# Patient Record
Sex: Female | Born: 1975 | Race: Black or African American | Hispanic: No | Marital: Married | Smoking: Never smoker
Health system: Southern US, Community
[De-identification: ages and names within clinical notes are randomized; demographics above are authoritative.]

## PROBLEM LIST (undated history)

## (undated) DIAGNOSIS — D649 Anemia, unspecified: Secondary | ICD-10-CM

## (undated) DIAGNOSIS — I10 Essential (primary) hypertension: Secondary | ICD-10-CM

## (undated) DIAGNOSIS — E669 Obesity, unspecified: Secondary | ICD-10-CM

## (undated) HISTORY — DX: Obesity, unspecified: E66.9

## (undated) HISTORY — DX: Anemia, unspecified: D64.9

## (undated) HISTORY — DX: Essential (primary) hypertension: I10

---

## 2013-01-27 ENCOUNTER — Other Ambulatory Visit: Payer: Self-pay

## 2013-01-28 ENCOUNTER — Other Ambulatory Visit: Payer: Self-pay | Admitting: Family Medicine

## 2013-01-28 ENCOUNTER — Other Ambulatory Visit: Payer: Self-pay | Admitting: Internal Medicine

## 2013-01-28 DIAGNOSIS — N644 Mastodynia: Secondary | ICD-10-CM

## 2013-02-04 ENCOUNTER — Ambulatory Visit
Admission: RE | Admit: 2013-02-04 | Discharge: 2013-02-04 | Disposition: A | Discharge status: Home / Self Care | Payer: PRIVATE HEALTH INSURANCE | Source: Ambulatory Visit | Attending: Internal Medicine | Admitting: Internal Medicine

## 2013-02-04 DIAGNOSIS — N644 Mastodynia: Secondary | ICD-10-CM

## 2013-06-27 LAB — HEPATIC FUNCTION PANEL
ALK PHOS: 70 U/L (ref 25–125)
ALT: 10 U/L (ref 7–35)
AST: 12 U/L — AB (ref 13–35)
BILIRUBIN, TOTAL: 0.2 mg/dL

## 2013-06-27 LAB — BASIC METABOLIC PANEL
BUN: 8 mg/dL (ref 4–21)
Creatinine: 1.1 mg/dL (ref 0.5–1.1)
GLUCOSE: 106 mg/dL
Potassium: 3.2 mmol/L — AB (ref 3.4–5.3)

## 2013-10-15 LAB — TSH: TSH: 0.51 u[IU]/mL (ref 0.41–5.90)

## 2013-10-23 ENCOUNTER — Other Ambulatory Visit: Payer: Self-pay | Admitting: Internal Medicine

## 2013-10-23 ENCOUNTER — Other Ambulatory Visit: Payer: Self-pay | Admitting: Family Medicine

## 2013-10-23 DIAGNOSIS — E221 Hyperprolactinemia: Secondary | ICD-10-CM

## 2013-10-31 ENCOUNTER — Other Ambulatory Visit: Payer: PRIVATE HEALTH INSURANCE

## 2013-11-26 ENCOUNTER — Encounter: Payer: Self-pay | Admitting: Internal Medicine

## 2013-11-26 ENCOUNTER — Ambulatory Visit (INDEPENDENT_AMBULATORY_CARE_PROVIDER_SITE_OTHER): Payer: PRIVATE HEALTH INSURANCE | Admitting: Internal Medicine

## 2013-11-26 ENCOUNTER — Other Ambulatory Visit: Payer: Self-pay | Admitting: Internal Medicine

## 2013-11-26 VITALS — BP 124/88 | HR 102 | Temp 98.4°F | Resp 12 | Ht 64.0 in | Wt 236.0 lb

## 2013-11-26 DIAGNOSIS — N912 Amenorrhea, unspecified: Secondary | ICD-10-CM

## 2013-11-26 DIAGNOSIS — R7989 Other specified abnormal findings of blood chemistry: Secondary | ICD-10-CM

## 2013-11-26 DIAGNOSIS — E229 Hyperfunction of pituitary gland, unspecified: Secondary | ICD-10-CM

## 2013-11-26 NOTE — Patient Instructions (Addendum)
Please go to [facility] for labs. If they cannot do the Prolactin, Total and Monomeric, please have them do the regular prolactin. Please come back for a follow-up appointment in 3 months.

## 2013-11-26 NOTE — Progress Notes (Signed)
Patient ID: Tammy Gamble, female   DOB: [birth year], 38 y.o.   MRN: [id number]  HPI: Tammy Gamble is a 38 y.o. female, referred by Wilburt Finlay, NP ([facility]), in consultation for high prolactin. She also has amenorrhea, which was the original cause for the prolactin check.  She stopped having menses ~4 mo ago. She was having regular cycles before. PRL was checked and it was elevated.  I reviewed the records sent by her PCP - labs drawn on 10/15/2013: - Prolactin 48.8 - Beta-hCG negative - LH 57, FSH 79.1 - TSH 0.506  Patient also had a pituitary MRI on 10/27/2013, there was normal, with physiologic prominence of the pituitary  Weight gain: - cannot tell exactly how much - + steroid use: 2 weeks ago solumedrol for bronchitis; had one injection before (this year) - no weight loss meds - Meals: - Breakfast: - Lunch:  - Dinner:  - Snacks:  Drinks:   Fertility/Menstrual cycles: - regular menses before 4 mo ago, but sometimes spotting - no h/o ovarian cysts; she was told she has only one ovary (or tube, she cannot remember). - children: 2 - miscarriages: no - contraception: tubal ligation 2011 She did not use protection for 12 years without getting pregnant (son 48 y/o, daughter 32 y/o). She became pregnant 2 mo after the tubal ligation! She had a tubal pregnancy in 2010. No sx required.   Acne: - no  Hirsutism: - no  She c/o hot flushes, mostly at night.  No FH of infertility or early pregnancy.  Has FH of hypothyroidism: MGM, several maternal aunts, maternal uncle.  No allergy to peanuts.  ROS: Constitutional: + weight gain, + fatigue, + hot flashes, + poor sleep Eyes: no blurry vision, no xerophthalmia ENT: no sore throat, no nodules palpated in throat, no dysphagia/odynophagia, no hoarseness Cardiovascular: no CP/SOB/palpitations/leg swelling Respiratory: no cough/SOB Gastrointestinal: no N/V/D/C Musculoskeletal: no muscle/joint aches Skin: +  acne, + hair on face Neurological: no tremors/numbness/tingling/dizziness Psychiatric: no depression/anxiety + Low libido  Past Medical History  Diagnosis Date  . Hypertension   . Obesity   . Anemia     Iron deficiency   Past Surgical History  Procedure Laterality Date  . Cesarean section      1998 & 2011   History   Occupational History  .  admin receptionist    Social History Main Topics  . Smoking status: Never Smoker   . Smokeless tobacco: Not on file  . Alcohol Use: No  . Drug Use: No   Social History Narrative   Married   2 children      Current Outpatient Rx  Name  Route  Sig  Dispense  Refill  . hydrochlorothiazide (MICROZIDE) 12.5 MG capsule   Oral   Take 12.5 mg by mouth daily.      5    Allergies  Allergen Reactions  . Sulfa Antibiotics Shortness Of Breath   Family History  Problem Relation Age of Onset  . Cancer Maternal Grandmother     Breast Cancer  . Diabetes Maternal Grandmother   . Hypertension Maternal Grandmother   . Hyperlipidemia Maternal Grandmother   . Cancer Paternal Grandfather     Brain Cancer   PE: BP 124/88 mmHg  Pulse 102  Temp(Src) 98.4 F (36.9 C) (Oral)  Resp 12  Ht 5\' 4"  (1.626 m)  Wt 236 lb (107.049 kg)  BMI 40.49 kg/m2  SpO2 96% Wt Readings from Last 3 Encounters:  11/26/13 236 lb (107.049  kg)   Constitutional: overweight, in NAD, no full supraclavicular fat pads Eyes: PERRLA, EOMI, no exophthalmos ENT: moist mucous membranes, no thyromegaly, no cervical lymphadenopathy Cardiovascular: RRR, No MRG Respiratory: CTA B Gastrointestinal: abdomen soft, NT, ND, BS+ Musculoskeletal: no deformities, strength intact in all 4 Skin: moist, warm; no acne on face, no dark terminal hair on chin, no purple, wide, stretch marks Neurological: no tremor with outstretched hands, DTR normal in all 4  ASSESSMENT: 1. Elevated prolactin level  2. Amenorrhea  PLAN: 1.  Patient with persistent high prolactin levels, and  without evidence of a pituitary tumor.  - I discussed with the patient about possible etiologies of high prolactin levels:  Pregnancy  Hypothyroidism (recent TSH was normal)  Stress   Exercise   Lack of sleep   Medications (she's not taking psychotropic medications or Reglan)  Drugs (reviewed her medication list and she is not on any medicines known to increase prolactin)  Chest wall lesions (denies)  Seizures (denies)  Liver ds (denies)  Kidney disease (denies)  A teratoma containing pituitary cells that can develop into prolactinoma (very rarely)  Macroprolactin (multimeric prolactin) - we'll need to check  idiopathic - high prolactin most frequently impacts menstrual cycles, but usually not that these low levels, and, also, prolactin causes amenorrhea by suppressing LH and FSH, which is not her case. - we will check monomeric and total prolactin - if she can get these done at [facility], will also check the rest of her pituitary hormones: - TFTs - IGF-I - Cortisol and ACTH  - LH and FSH - options for tx of her high PRL levels are: Bromocriptine or cabergoline, low dose - if the high PRL level is not caused by macroprolactin. If the multimeric prolactin is elevated, no treatment is needed.  2. Amenorrhea - Negative pregnancy test a month ago - Prolactin slightly elevated, not in the range to cause amenorrhea - I believe that her amenorrhea is independent of her high prolactin - We had a long discussion about possible reasons for amenorrhea. She does not have evidence of PCOS, no signs of [place] CAH. One of the considerations is Cushing syndrome, especially in the setting of her increased weight gain, however this could be due to her recent steroid injections.  - We discussed about premature ovarian insufficiency (POI). I explained the implications of the diagnosis, if she turns out to have this, the need to test for karyotype and FMR mutation if the preliminary investigation is  negative. I also explained the implications of a positive FMR mutation for her children. She has a 39 year old son who does not have mental retardation.  - We'll check the following tests: Orders Placed This Encounter  Procedures  . Prolactin, Total and Monomeric  . Insulin-like growth factor  . ACTH  . Cortisol  . TSH  . T4, free  . T3, free  . Thyroid Peroxidase Antibody  . 21-Hydroxylase Antibodies  . Testosterone, free, total  . Estradiol  . Luteinizing hormone  . Follicle Stimulating Hormone  . Androstenedione  . DHEA-sulfate  . Prolactin  (regular prolactin was ordered in case multimeric prolactin cannot be performed at [facility].  - We also discussed about the fact that if she has POI, she will need to start hormone replacement therapy at least until the average age of menopause, which is 39 years old. I explained what this consists of. We will most likely use a weekly estrogen patch and Prometrium (she is not allergic to peanuts). -  Patient is not planning for any further pregnancies  - time spent with the patient: 1 hour, of which >50% was spent in obtaining information about her symptoms, reviewing her previous labs, evaluations, and treatments, counseling her about her condition (please see the discussed topics above), and developing a plan to further investigate it. She had a number of questions which I addressed.  Component     Latest Ref Rng 11/26/2013  Testosterone, total     10.0 - 55.0 ng/dL 56.235.9  Testosterone Free     0.0 - 4.2 pg/mL 1.1  21-Hydroxylase Antibodies      <1.0  Free T4     0.82 - 1.77 ng/dL 1.301.01  DHEA-SO4     86.557.3 - 279.2 ug/dL 784.6102.4  Cortisol     2.3 - 19.4 ug/dL 96.210.0  TSH     9.5280.450 - 4.1324.500 uIU/mL 0.537  LH      63.8  FSH      95.0  ACTH     7.2 - 63.3 pg/mL 19.3  Prolactin     4.8 - 23.3 ng/mL 7.4  Estradiol      15.3  Androstenedione     41 - 262 ng/dL 62  Insulin-Like GF-1     69 - 227 ng/mL 104  Thyroid Peroxidase Ab     0 -  34 IU/mL 112 (H)  T3, Free     2.0 - 4.4 pg/mL 3.0   Prolactin normal. TFTs normal, however, TPO antibodies are elevated, indicating Hashimoto's thyroiditis. Adrenal antibodies are not elevated and her cortisol is norma, ruling out adrenal insufficiency. Testosterone is also normal ruling out PCOS.  Her DHEAS and androstenedione are normal, 17 hydroxyprogesterone was not performed due to lack of symptoms and signs of CAH.  IGF-I is normal, as are the rest of the pituitary hormones.  Estradiol is low, LH and FSH are high, pointing towards POI. At this point, I would like to perform an FMR analysis and also karyotype. I will ask the patient to return to have these done. After these results are back,   we'll need to start hormone replacement therapy.

## 2013-12-05 LAB — FOLLICLE STIMULATING HORMONE: FSH: 95 m[IU]/mL

## 2013-12-05 LAB — ACTH: ACTH: 19.3 pg/mL (ref 7.2–63.3)

## 2013-12-05 LAB — PROLACTIN: Prolactin: 7.4 ng/mL (ref 4.8–23.3)

## 2013-12-05 LAB — CORTISOL: Cortisol: 10 ug/dL (ref 2.3–19.4)

## 2013-12-05 LAB — T4, FREE: Free T4: 1.01 ng/dL (ref 0.82–1.77)

## 2013-12-05 LAB — THYROID PEROXIDASE ANTIBODY: THYROID PEROXIDASE AB: 112 [IU]/mL — AB (ref 0–34)

## 2013-12-05 LAB — INSULIN-LIKE GROWTH FACTOR: INSULIN LIKE GF 1: 104 ng/mL (ref 69–227)

## 2013-12-05 LAB — TESTOSTERONE, FREE, TOTAL, SHBG
TESTOSTERONE FREE: 1.1 pg/mL (ref 0.0–4.2)
TESTOSTERONE, TOTAL: 35.9 ng/dL (ref 10.0–55.0)

## 2013-12-05 LAB — 21-HYDROXYLASE ANTIBODIES

## 2013-12-05 LAB — ESTRADIOL: ESTRADIOL: 15.3 pg/mL

## 2013-12-05 LAB — TSH: TSH: 0.537 u[IU]/mL (ref 0.450–4.500)

## 2013-12-05 LAB — DHEA-SULFATE: DHEA SO4: 102.4 ug/dL (ref 57.3–279.2)

## 2013-12-05 LAB — LUTEINIZING HORMONE: LH: 63.8 m[IU]/mL

## 2013-12-05 LAB — T3, FREE: T3 FREE: 3 pg/mL (ref 2.0–4.4)

## 2013-12-05 LAB — ANDROSTENEDIONE: Androstenedione: 62 ng/dL (ref 41–262)

## 2013-12-08 ENCOUNTER — Encounter: Payer: Self-pay | Admitting: Internal Medicine

## 2013-12-17 ENCOUNTER — Encounter: Payer: Self-pay | Admitting: Internal Medicine

## 2013-12-19 ENCOUNTER — Telehealth: Payer: Self-pay | Admitting: Internal Medicine

## 2013-12-19 ENCOUNTER — Other Ambulatory Visit: Payer: Self-pay | Admitting: *Deleted

## 2013-12-19 DIAGNOSIS — N912 Amenorrhea, unspecified: Secondary | ICD-10-CM

## 2013-12-19 DIAGNOSIS — E229 Hyperfunction of pituitary gland, unspecified: Secondary | ICD-10-CM

## 2013-12-19 DIAGNOSIS — R7989 Other specified abnormal findings of blood chemistry: Secondary | ICD-10-CM

## 2013-12-19 NOTE — Telephone Encounter (Signed)
Pt wants lab orders sent to [facility] [phone]) in [place]. Please advise which labs to send. Thank you.

## 2013-12-19 NOTE — Telephone Encounter (Signed)
The last 2 orders: FMR analysis and peripheral blood chromosomal analysis

## 2013-12-19 NOTE — Telephone Encounter (Signed)
Patient stated that Dr Elvera LennoxGherghe wanted her to have to test done please send orders to [facility] in [place]

## 2014-02-26 ENCOUNTER — Ambulatory Visit: Payer: PRIVATE HEALTH INSURANCE | Admitting: Internal Medicine

## 2015-02-10 ENCOUNTER — Other Ambulatory Visit: Payer: Self-pay | Admitting: Family Medicine

## 2015-02-10 DIAGNOSIS — Z1231 Encounter for screening mammogram for malignant neoplasm of breast: Secondary | ICD-10-CM

## 2015-03-03 ENCOUNTER — Ambulatory Visit: Payer: PRIVATE HEALTH INSURANCE

## 2015-03-03 ENCOUNTER — Ambulatory Visit
Admission: RE | Admit: 2015-03-03 | Discharge: 2015-03-03 | Disposition: A | Discharge status: Home / Self Care | Payer: PRIVATE HEALTH INSURANCE | Source: Ambulatory Visit | Attending: Internal Medicine | Admitting: Internal Medicine

## 2015-03-03 DIAGNOSIS — Z1231 Encounter for screening mammogram for malignant neoplasm of breast: Secondary | ICD-10-CM

## 2016-02-14 ENCOUNTER — Other Ambulatory Visit: Payer: Self-pay | Admitting: Family Medicine

## 2016-02-14 DIAGNOSIS — Z1231 Encounter for screening mammogram for malignant neoplasm of breast: Secondary | ICD-10-CM

## 2016-02-18 ENCOUNTER — Other Ambulatory Visit: Payer: Self-pay | Admitting: Physician Assistant

## 2016-02-18 DIAGNOSIS — N644 Mastodynia: Secondary | ICD-10-CM

## 2016-02-22 ENCOUNTER — Other Ambulatory Visit: Payer: Self-pay | Admitting: Physician Assistant

## 2016-02-22 DIAGNOSIS — N644 Mastodynia: Secondary | ICD-10-CM

## 2016-03-03 ENCOUNTER — Ambulatory Visit
Admission: RE | Admit: 2016-03-03 | Discharge: 2016-03-03 | Disposition: A | Discharge status: Home / Self Care | Payer: PRIVATE HEALTH INSURANCE | Source: Ambulatory Visit | Attending: Physician Assistant | Admitting: Physician Assistant

## 2016-03-03 DIAGNOSIS — N644 Mastodynia: Secondary | ICD-10-CM

## 2017-07-12 ENCOUNTER — Other Ambulatory Visit: Payer: Self-pay | Admitting: Physician Assistant

## 2017-07-12 DIAGNOSIS — Z1231 Encounter for screening mammogram for malignant neoplasm of breast: Secondary | ICD-10-CM

## 2017-07-16 ENCOUNTER — Ambulatory Visit
Admission: RE | Admit: 2017-07-16 | Discharge: 2017-07-16 | Disposition: A | Discharge status: Home / Self Care | Payer: PRIVATE HEALTH INSURANCE | Source: Ambulatory Visit | Attending: Physician Assistant | Admitting: Physician Assistant

## 2017-07-16 DIAGNOSIS — Z1231 Encounter for screening mammogram for malignant neoplasm of breast: Secondary | ICD-10-CM

## 2018-11-27 ENCOUNTER — Other Ambulatory Visit: Payer: Self-pay | Admitting: Sports Medicine

## 2018-11-27 ENCOUNTER — Other Ambulatory Visit: Payer: Self-pay

## 2018-11-27 ENCOUNTER — Ambulatory Visit: Payer: Self-pay | Admitting: Sports Medicine

## 2018-11-27 DIAGNOSIS — M79671 Pain in right foot: Secondary | ICD-10-CM

## 2019-01-17 DIAGNOSIS — N200 Calculus of kidney: Secondary | ICD-10-CM | POA: Insufficient documentation

## 2019-02-04 DIAGNOSIS — E221 Hyperprolactinemia: Secondary | ICD-10-CM | POA: Insufficient documentation

## 2019-02-04 DIAGNOSIS — Z1231 Encounter for screening mammogram for malignant neoplasm of breast: Secondary | ICD-10-CM | POA: Insufficient documentation

## 2019-02-04 DIAGNOSIS — Z780 Asymptomatic menopausal state: Secondary | ICD-10-CM | POA: Insufficient documentation

## 2019-03-18 ENCOUNTER — Other Ambulatory Visit: Payer: Self-pay | Admitting: Sports Medicine

## 2019-03-18 DIAGNOSIS — M79671 Pain in right foot: Secondary | ICD-10-CM

## 2019-03-19 ENCOUNTER — Telehealth: Payer: Self-pay | Admitting: *Deleted

## 2019-03-19 ENCOUNTER — Ambulatory Visit (INDEPENDENT_AMBULATORY_CARE_PROVIDER_SITE_OTHER): Payer: PRIVATE HEALTH INSURANCE

## 2019-03-19 ENCOUNTER — Other Ambulatory Visit: Payer: Self-pay

## 2019-03-19 ENCOUNTER — Other Ambulatory Visit: Payer: Self-pay | Admitting: Obstetrics and Gynecology

## 2019-03-19 ENCOUNTER — Ambulatory Visit: Payer: PRIVATE HEALTH INSURANCE | Admitting: Sports Medicine

## 2019-03-19 ENCOUNTER — Encounter: Payer: Self-pay | Admitting: Sports Medicine

## 2019-03-19 DIAGNOSIS — M79671 Pain in right foot: Secondary | ICD-10-CM

## 2019-03-19 DIAGNOSIS — M67479 Ganglion, unspecified ankle and foot: Secondary | ICD-10-CM

## 2019-03-19 DIAGNOSIS — M6740 Ganglion, unspecified site: Secondary | ICD-10-CM

## 2019-03-19 DIAGNOSIS — M7989 Other specified soft tissue disorders: Secondary | ICD-10-CM

## 2019-03-19 DIAGNOSIS — Z1231 Encounter for screening mammogram for malignant neoplasm of breast: Secondary | ICD-10-CM

## 2019-03-19 NOTE — Telephone Encounter (Signed)
Left message informing pt [facility] was not longer performing soft tissue US, but were referring to [facility] [phone].

## 2019-03-19 NOTE — Telephone Encounter (Signed)
-----   Message from Asencion Islam, [place] sent at 03/19/2019 12:15 PM EDT ----- Regarding: [facility] ultrasound Ultrasound right foot to evaluate soft tissue mass at the dorsal lateral aspect

## 2019-03-19 NOTE — Progress Notes (Signed)
Subjective: Tammy Gamble is a 44 y.o. female patient who presents to office for evaluation of occasional right foot pain.  Patient ranks pain 5 out of 10 sharp shooting to the soft tissue bump at the right foot patient reports that he has been present for several months to a year and she believes that her PCP has measured it before and thinks that it is staying relatively the same size but she wanted to have it checked because there is some pain occasionally to the area.  Patient describes pain worse sometimes with certain shoes that may rub the area but otherwise she is able to do normal activities without any major limitation.  Patient denies any other pedal complaints. Denies injury/trip/fall/sprain/any causative factors.   Review of Systems  All other systems reviewed and are negative.    Patient Active Problem List   Diagnosis Date Noted  . Encounter for screening mammogram for breast cancer 02/04/2019  . 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 Menopause 02/04/2019  . Kidney stone 01/17/2019  . Amenorrhea 11/26/2013  . Elevated prolactin level 11/26/2013    Current Outpatient Medications on File Prior to Visit  Medication Sig Dispense Refill  . ciprofloxacin (CIPRO) 500 MG tablet SMARTSIG:1 Tablet(s) By Mouth Every 12 Hours    . hydrochlorothiazide (MICROZIDE) 12.5 MG capsule Take 12.5 mg by mouth daily.  5  . naproxen (NAPROSYN) 500 MG tablet Take 500 mg by mouth 2 (two) times daily.    . nitrofurantoin, macrocrystal-monohydrate, (MACROBID) 100 MG capsule Take 100 mg by mouth 2 (two) times daily.     No current facility-administered medications on file prior to visit.    Allergies  Allergen Reactions  . Sulfa Antibiotics Shortness Of Breath    Objective:  General: Alert and oriented x3 in no acute distress  Dermatology: Raised soft tissue mass measuring 2 x 1 cm mass is fluctuant but well delineated consistent with changes of cyst to the area, no open lesions bilateral  lower extremities, no webspace macerations, no ecchymosis bilateral, all nails x 10 are well manicured.  Vascular: Dorsalis Pedis and Posterior Tibial pedal pulses palpable, Capillary Fill Time 3 seconds,(+) pedal hair growth bilateral, no edema bilateral lower extremities, Temperature gradient within normal limits.  Neurology: Johney Maine sensation intact via light touch bilateral.  Musculoskeletal: No obvious reproducible tenderness on today's exam over the soft tissue mass on the right foot however occasionally patient does get sharp shooting pain off and on to the area and also notices some clicking to her foot and ankle but is painless.  Gait: Non-Antalgic gait  Xrays  Right foot   Impression: No acute osseous findings soft tissue mass possibly correlates with the extensor tendon versus fourth fifth metatarsal base/joint  Assessment and Plan: Problem List Items Addressed This Visit    None    Visit Diagnoses    Soft tissue mass    -  Primary   Ganglion cyst of foot       Right foot pain           -Complete examination performed -Xrays reviewed -Discussed treatement options for likely soft tissue mass that is a cyst -Rx ultrasound for further evaluation -At this time patient decided to not move forward with an aspiration procedure in office wants to get an ultrasound first and reports that she will closely watch it and states that she wants to be very careful with her foot because she does have a history of gout and have had gout flareups before in  the past -Recommend good supportive shoe that do not rub or irritate the area -Patient to return to office after ultrasound or sooner if condition worsens.  Asencion Islam, DPM

## 2019-03-28 ENCOUNTER — Other Ambulatory Visit: Payer: Self-pay | Admitting: Sports Medicine

## 2019-03-28 DIAGNOSIS — M7989 Other specified soft tissue disorders: Secondary | ICD-10-CM

## 2019-03-28 DIAGNOSIS — M6740 Ganglion, unspecified site: Secondary | ICD-10-CM

## 2019-04-04 ENCOUNTER — Ambulatory Visit: Payer: PRIVATE HEALTH INSURANCE

## 2019-04-04 ENCOUNTER — Ambulatory Visit
Admission: RE | Admit: 2019-04-04 | Discharge: 2019-04-04 | Disposition: A | Discharge status: Home / Self Care | Payer: PRIVATE HEALTH INSURANCE | Source: Ambulatory Visit | Attending: Sports Medicine | Admitting: Sports Medicine

## 2019-04-04 DIAGNOSIS — M7989 Other specified soft tissue disorders: Secondary | ICD-10-CM

## 2019-04-04 DIAGNOSIS — M79671 Pain in right foot: Secondary | ICD-10-CM

## 2019-04-04 DIAGNOSIS — M67479 Ganglion, unspecified ankle and foot: Secondary | ICD-10-CM

## 2019-04-07 ENCOUNTER — Telehealth: Payer: Self-pay

## 2019-04-07 NOTE — Telephone Encounter (Signed)
-----   Message from Asencion Islam, [place] sent at 04/07/2019 12:47 PM EDT ----- Ultrasound shows that she has a cyst on her R foot. At this time she can 1. Continue to monitor it because this type of cyst is not harmful or 2. Return to office for aspiration procedure (drain fluid off) but theres still a chance that it can come back or 3. Return to office for a surgery consult for [place] to plan to do an excision/cut out the cyst in the OR -Dr. Kathie Rhodes

## 2019-04-07 NOTE — Telephone Encounter (Signed)
Called pt to review [place] results. Pt states she would like to continue to monitor the cyst and if any changes she will give [place] a call.

## 2019-05-05 ENCOUNTER — Ambulatory Visit
Admission: RE | Admit: 2019-05-05 | Discharge: 2019-05-05 | Disposition: A | Discharge status: Home / Self Care | Payer: PRIVATE HEALTH INSURANCE | Source: Ambulatory Visit | Attending: Obstetrics and Gynecology | Admitting: Obstetrics and Gynecology

## 2019-05-05 ENCOUNTER — Other Ambulatory Visit: Payer: Self-pay

## 2019-05-05 DIAGNOSIS — Z1231 Encounter for screening mammogram for malignant neoplasm of breast: Secondary | ICD-10-CM

## 2019-12-17 ENCOUNTER — Ambulatory Visit: Payer: PRIVATE HEALTH INSURANCE | Admitting: Sports Medicine

## 2019-12-17 ENCOUNTER — Other Ambulatory Visit: Payer: Self-pay

## 2019-12-17 ENCOUNTER — Other Ambulatory Visit: Payer: Self-pay | Admitting: Sports Medicine

## 2019-12-17 DIAGNOSIS — M67479 Ganglion, unspecified ankle and foot: Secondary | ICD-10-CM

## 2019-12-17 DIAGNOSIS — M2041 Other hammer toe(s) (acquired), right foot: Secondary | ICD-10-CM

## 2019-12-17 DIAGNOSIS — M79671 Pain in right foot: Secondary | ICD-10-CM

## 2019-12-17 DIAGNOSIS — M7989 Other specified soft tissue disorders: Secondary | ICD-10-CM

## 2019-12-17 NOTE — Progress Notes (Signed)
Subjective: Tammy Gamble is a 44 y.o. female patient who returns office for follow-up evaluation of occasional right foot pain. Patient reports that the cyst has come back and gotten bigger for the last few months with some aching only when she is at rest no pain with walking or activity sometimes can be as bad as 7 out of 10. Denies redness warmth drainage or any signs of infection. Takes Tylenol occasionally that gives relief. Patient reports that she wants to further discuss treatment options for her cyst.   Patient Active Problem List   Diagnosis Date Noted  . Encounter for screening mammogram for breast cancer 02/04/2019  . Hyperprolactinemia ([facility]) 02/04/2019  . Menopause 02/04/2019  . Kidney stone 01/17/2019  . Amenorrhea 11/26/2013  . Elevated prolactin level 11/26/2013    Current Outpatient Medications on File Prior to Visit  Medication Sig Dispense Refill  . allopurinol (ZYLOPRIM) 100 MG tablet TAKE ONE TABLET BY MOUTH DAILY    . ascorbic acid (VITAMIN C) 250 MG tablet Take by mouth.    . ciprofloxacin (CIPRO) 500 MG tablet SMARTSIG:1 Tablet(s) By Mouth Every 12 Hours    . diazepam (VALIUM) 2 MG tablet Take 2 mg by mouth every 6 (six) hours as needed.    . hydrochlorothiazide (MICROZIDE) 12.5 MG capsule Take 12.5 mg by mouth daily.  5  . naproxen (NAPROSYN) 500 MG tablet Take 500 mg by mouth 2 (two) times daily.    . nitrofurantoin, macrocrystal-monohydrate, (MACROBID) 100 MG capsule Take 100 mg by mouth 2 (two) times daily.    Marland Kitchen zinc gluconate 50 MG tablet Take by mouth.     No current facility-administered medications on file prior to visit.    Allergies  Allergen Reactions  . Sulfa Antibiotics Shortness Of Breath    Objective:  General: Alert and oriented x3 in no acute distress  Dermatology: Raised soft tissue mass measuring 2 x1.5 cm mass is fluctuant but well delineated consistent with changes of cyst to the area, no open lesions bilateral lower extremities, no  webspace macerations, no ecchymosis bilateral, all nails x 10 are well manicured there is mild reactive keratosis noted to the right fifth toe.  Vascular: Dorsalis Pedis and Posterior Tibial pedal pulses palpable, Capillary Fill Time 3 seconds,(+) pedal hair growth bilateral, no edema bilateral lower extremities, Temperature gradient within normal limits.  Neurology: Michaell Cowing sensation intact via light touch bilateral.  Musculoskeletal: Minimal tenderness over the soft tissue mass on the right foot. There is significant fifth hammertoe contracture noted on the right foot.  Previous ultrasound consistent with cyst  Assessment and Plan: Problem List Items Addressed This Visit   None   Visit Diagnoses    Soft tissue mass    -  Primary   Ganglion cyst of foot       Right foot pain       Hammer toe of right foot         -Complete examination performed -Previous ultrasound results reviewed -EwDiscussed treatement options for likely soft tissue mass that is a cyst And for hammertoe -At this time patient wants to continue to think about treatment options I advised patient to closely monitor if measurements continue to change may benefit from surgical excision  -Dispensed coban for patient to apply light compression wrap to see if this will help her body reabsorb the cyst -Dispensed toe cap for patient to use at right fifth toe -Recommend good supportive shoe that do not rub or irritate the areas -Patient to  return to office when ready for surgery excision versus aspiration in office or sooner if condition worsens.  Asencion Islam, DPM

## 2020-09-10 ENCOUNTER — Other Ambulatory Visit: Payer: Self-pay | Admitting: Family Medicine

## 2020-09-10 DIAGNOSIS — Z1231 Encounter for screening mammogram for malignant neoplasm of breast: Secondary | ICD-10-CM

## 2020-09-15 ENCOUNTER — Inpatient Hospital Stay: Admission: RE | Admit: 2020-09-15 | Payer: PRIVATE HEALTH INSURANCE | Source: Ambulatory Visit

## 2020-10-13 ENCOUNTER — Ambulatory Visit
Admission: RE | Admit: 2020-10-13 | Discharge: 2020-10-13 | Disposition: A | Discharge status: Home / Self Care | Payer: BC Managed Care – PPO | Source: Ambulatory Visit | Attending: Family Medicine | Admitting: Family Medicine

## 2020-10-13 ENCOUNTER — Other Ambulatory Visit: Payer: Self-pay

## 2020-10-13 DIAGNOSIS — Z1231 Encounter for screening mammogram for malignant neoplasm of breast: Secondary | ICD-10-CM

## 2020-12-09 IMAGING — US US EXTREM LOW*R* LIMITED
2 series · 13 of 13 positions shown · non-contrast
Comparison: Right foot x-rays dated March 19, 2019.

CLINICAL DATA: Dorsal lateral forefoot soft tissue mass for the
past few months. Area appears to be decreasing in size according to
the patient.

EXAM:
ULTRASOUND RIGHT LOWER EXTREMITY LIMITED
TECHNIQUE: Ultrasound examination of the lower extremity soft tissues was
performed in the area of clinical concern.

[Series 1: us extrem low*right* limited · 0.06mm/px · 10 acquisitions, 10 frames shown (1 of 2)]
[im 1/10]
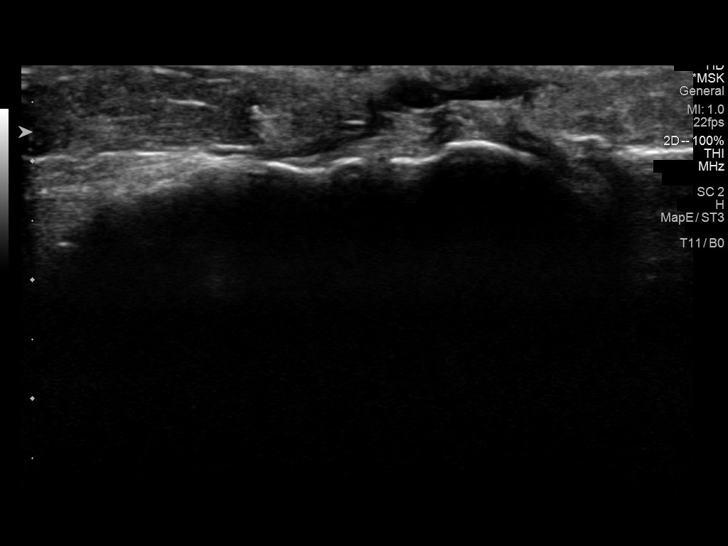
[im 2/10]
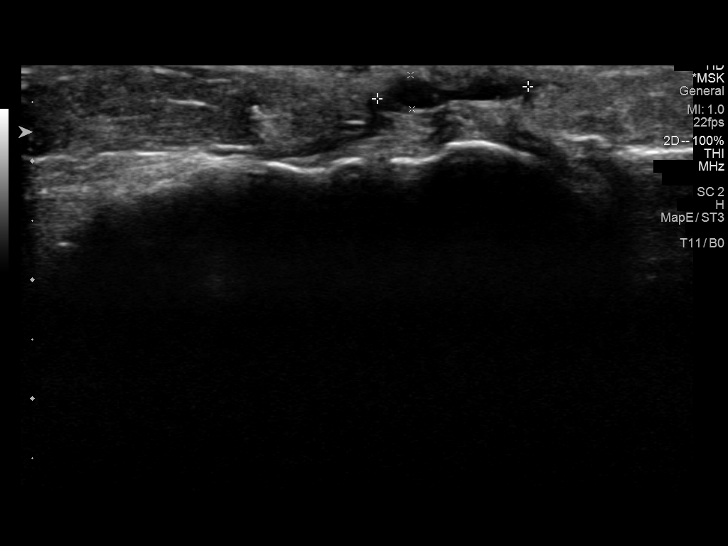
[im 3/10]
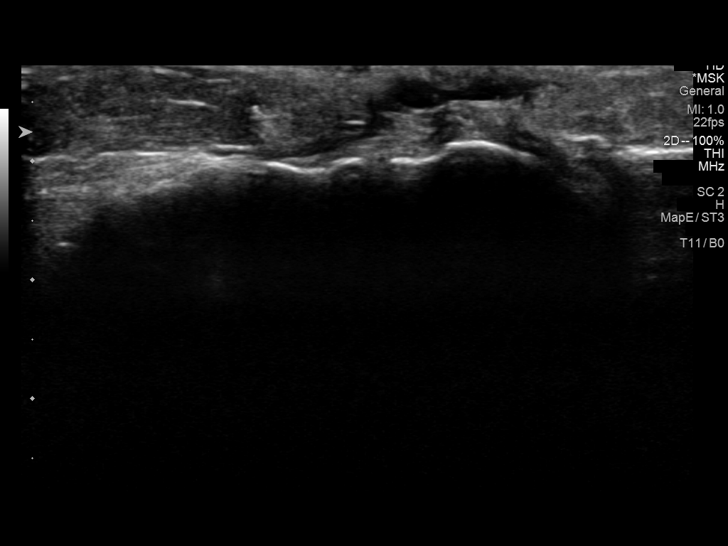
[im 4/10]
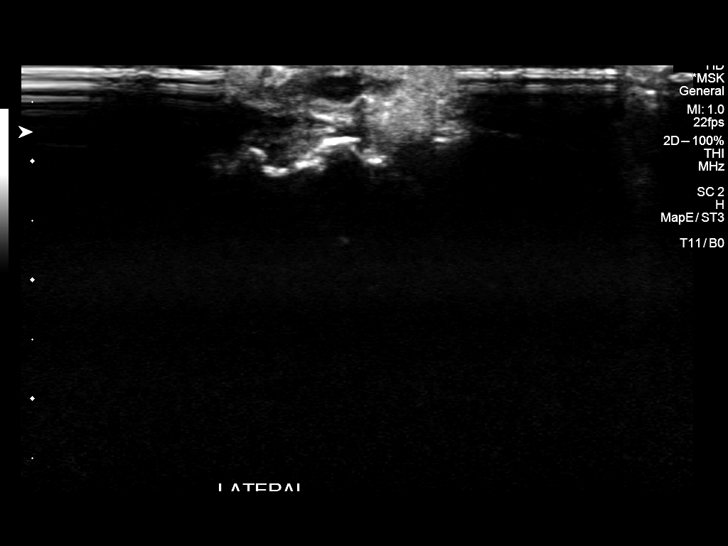
[im 5/10]
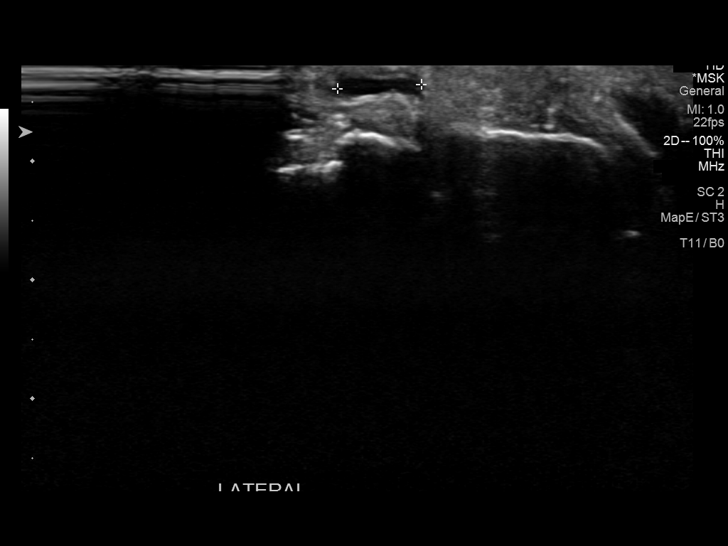
[im 6/10]
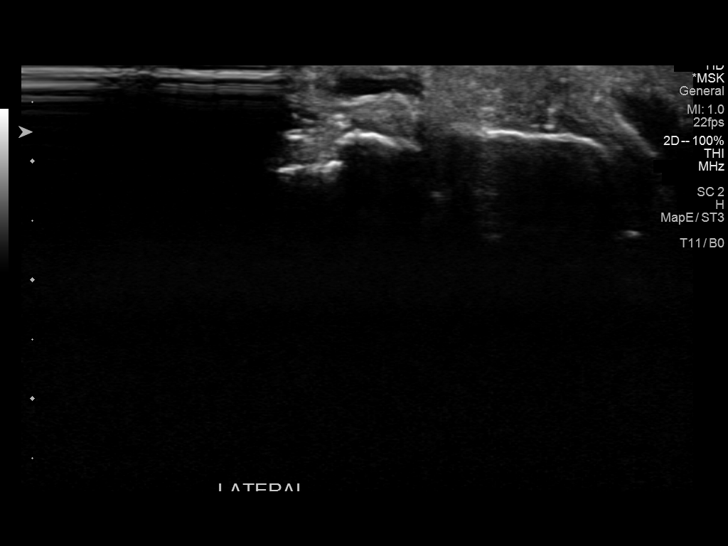
[im 7/10]
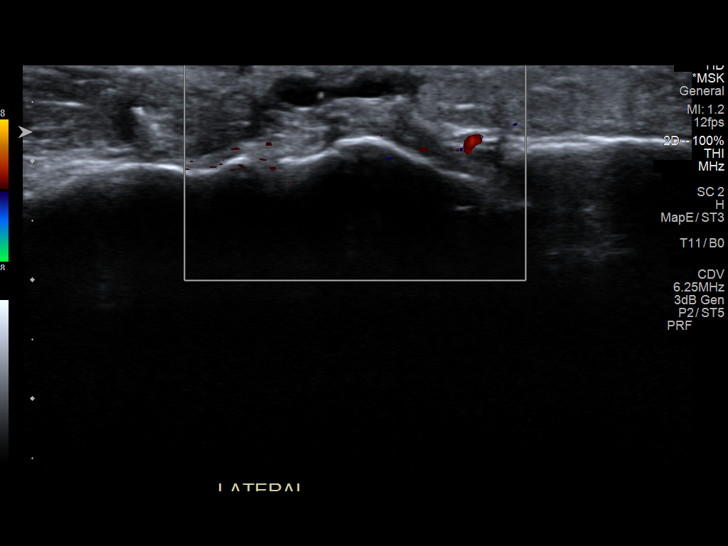
[im 8/10]
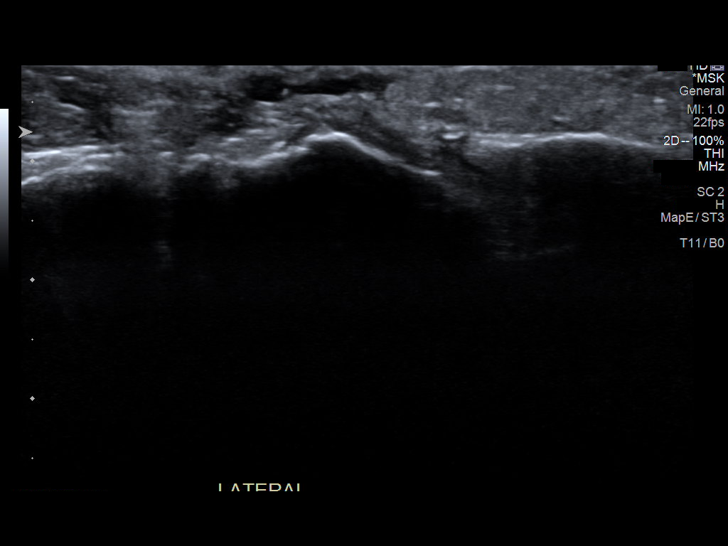
[im 9/10]
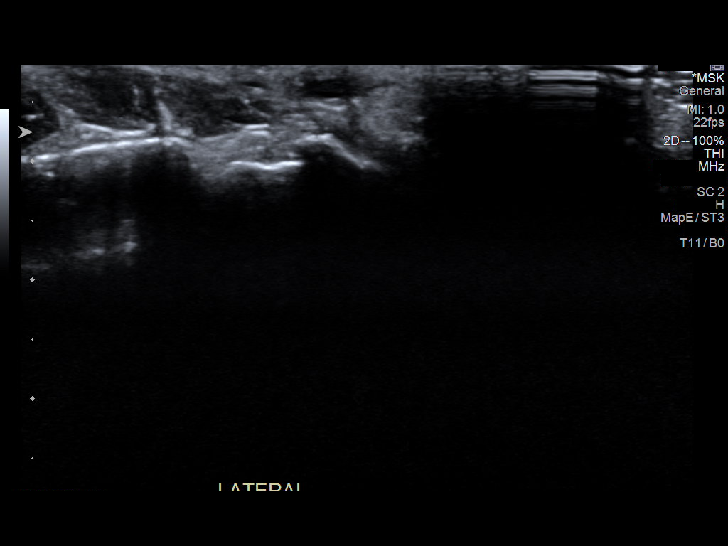
[im 10/10]
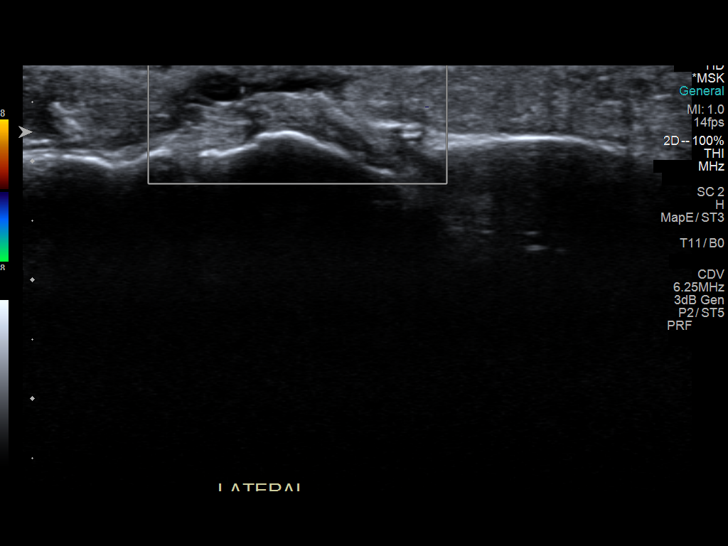

[Series 2: us extrem low*right* limited · 0.04mm/px · 3 of 3 slices shown (2 of 2)]
[im 1/3]
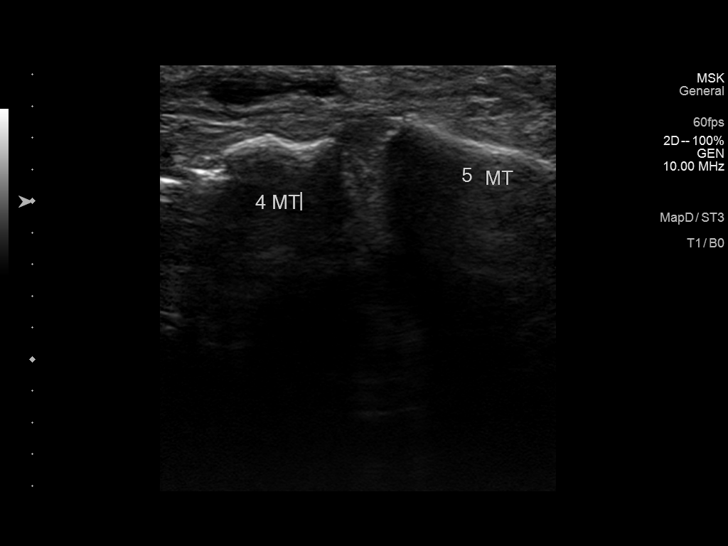
[im 2/3]
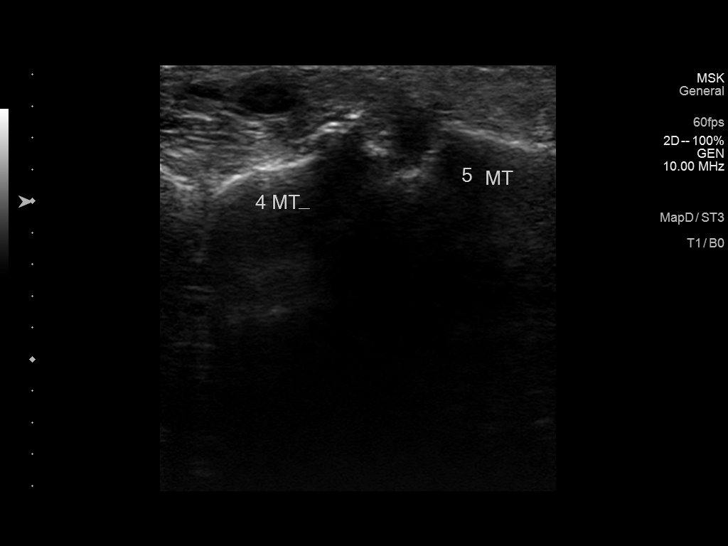
[im 3/3]
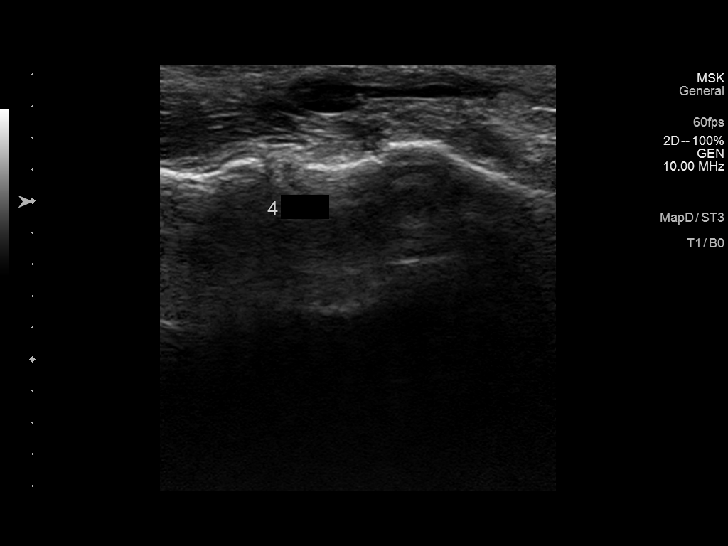

[13 of 13 positions shown; findings below may reference images not displayed]

FINDINGS: Focused ultrasound over the dorsal lateral forefoot demonstrates a
superficial 3 x 7 x 13 mm largely anechoic lesion dorsal to the base
of the fourth metatarsal near the fourth TMT joint. There is no
internal vascularity.
IMPRESSION: Palpable abnormality corresponds to a 13 mm ganglion cyst near the
base of the fourth metatarsal.

## 2020-12-15 ENCOUNTER — Other Ambulatory Visit: Payer: Self-pay

## 2020-12-15 ENCOUNTER — Encounter: Payer: Self-pay | Admitting: Cardiology

## 2020-12-15 ENCOUNTER — Ambulatory Visit (INDEPENDENT_AMBULATORY_CARE_PROVIDER_SITE_OTHER): Payer: BC Managed Care – PPO | Admitting: Cardiology

## 2020-12-15 VITALS — BP 136/90 | HR 89 | Ht 64.0 in | Wt 265.0 lb

## 2020-12-15 DIAGNOSIS — R600 Localized edema: Secondary | ICD-10-CM | POA: Diagnosis not present

## 2020-12-15 DIAGNOSIS — I4729 Other ventricular tachycardia: Secondary | ICD-10-CM

## 2020-12-15 DIAGNOSIS — R7303 Prediabetes: Secondary | ICD-10-CM

## 2020-12-15 DIAGNOSIS — Z79899 Other long term (current) drug therapy: Secondary | ICD-10-CM

## 2020-12-15 DIAGNOSIS — R0789 Other chest pain: Secondary | ICD-10-CM

## 2020-12-15 DIAGNOSIS — R9431 Abnormal electrocardiogram [ECG] [EKG]: Secondary | ICD-10-CM | POA: Diagnosis not present

## 2020-12-15 DIAGNOSIS — I10 Essential (primary) hypertension: Secondary | ICD-10-CM

## 2020-12-15 MED ORDER — METOPROLOL TARTRATE 100 MG PO TABS: ORAL_TABLET | ORAL | 0 refills | Status: DC

## 2020-12-15 NOTE — Progress Notes (Addendum)
Cardiology Office Note:    Date:  12/15/2020   ID:  Tammy Gamble, DOB 1975/09/22, MRN [id number]  PCP:  Abner Greenspan, MD  Cardiologist:  Thomasene Ripple, DO  Electrophysiologist:  None   Referring MD: Abner Greenspan, MD   Chief Complaint  Patient presents with   New Patient (Initial Visit)        Edema    Ankles.    History of Present Illness:    Tammy Gamble is a 45 y.o. female with a hx of hypertension, prediabetes, hyperlipidemia, family history of coronary artery disease was referred by her PCP because the patient has had a monitor which showed evidence of NSVT.  She also report that there has been some increasing leg edema as well as intermittent palpitations minimal shortness of breath.  Since the episode of NSVT on her monitor her PCP has started her on Metroprolol.  Past Medical History:  Diagnosis Date   Anemia    Iron deficiency   Hypertension    Obesity     Past Surgical History:  Procedure Laterality Date   CESAREAN SECTION     1998 & 2011    Current Medications: Current Meds  Medication Sig   hydrochlorothiazide (MICROZIDE) 12.5 MG capsule Take 12.5 mg by mouth daily.   metoprolol tartrate (LOPRESSOR) 100 MG tablet Take 2 hours prior to CT   [DISCONTINUED] ciprofloxacin (CIPRO) 500 MG tablet SMARTSIG:1 Tablet(s) By Mouth Every 12 Hours   [DISCONTINUED] diazepam (VALIUM) 2 MG tablet Take 2 mg by mouth every 6 (six) hours as needed.   [DISCONTINUED] naproxen (NAPROSYN) 500 MG tablet Take 500 mg by mouth 2 (two) times daily.   [DISCONTINUED] nitrofurantoin, macrocrystal-monohydrate, (MACROBID) 100 MG capsule Take 100 mg by mouth 2 (two) times daily.   [DISCONTINUED] zinc gluconate 50 MG tablet Take by mouth.     Allergies:   Sulfa antibiotics   Social History   Socioeconomic History   Marital status: Married    Spouse name: Not on file   Number of children: Not on file   Years of education: Not on file   Highest education level: Not on file   Occupational History   Not on file  Tobacco Use   Smoking status: Never   Smokeless tobacco: Not on file  Substance and Sexual Activity   Alcohol use: No    Alcohol/week: 0.0 standard drinks   Drug use: No   Sexual activity: Not on file  Other Topics Concern   Not on file  Social History Narrative   Married   2 children   Social Determinants of Health   Financial Resource Strain: Not on file  Food Insecurity: Not on file  Transportation Needs: Not on file  Physical Activity: Not on file  Stress: Not on file  Social Connections: Not on file     Family History: The patient's family history includes Breast cancer in her paternal aunt, paternal grandmother, and paternal great-grandmother; Cancer in her paternal grandfather; Diabetes in her maternal grandmother; Hyperlipidemia in her maternal grandmother; Hypertension in her maternal grandmother.  ROS:   Review of Systems  Constitution: Negative for decreased appetite, fever and weight gain.  HENT: Negative for congestion, ear discharge, hoarse voice and sore throat.   Eyes: Negative for discharge, redness, vision loss in right eye and visual halos.  Cardiovascular: Negative for chest pain, dyspnea on exertion, leg swelling, orthopnea and palpitations.  Respiratory: Negative for cough, hemoptysis, shortness of breath and snoring.   Endocrine: Negative for heat  intolerance and polyphagia.  Hematologic/Lymphatic: Negative for bleeding problem. Does not bruise/bleed easily.  Skin: Negative for flushing, nail changes, rash and suspicious lesions.  Musculoskeletal: Negative for arthritis, joint pain, muscle cramps, myalgias, neck pain and stiffness.  Gastrointestinal: Negative for abdominal pain, bowel incontinence, diarrhea and excessive appetite.  Genitourinary: Negative for decreased libido, genital sores and incomplete emptying.  Neurological: Negative for brief paralysis, focal weakness, headaches and loss of balance.   Psychiatric/Behavioral: Negative for altered mental status, depression and suicidal ideas.  Allergic/Immunologic: Negative for HIV exposure and persistent infections.    EKGs/Labs/Other Studies Reviewed:    The following studies were reviewed today:   EKG:  The ekg ordered today demonstrates sinus rhythm 97 with possible left atrial enlargement and anterior wall infarction of age-indeterminate.  Recent Labs: No results found for requested labs within last 8760 hours.  Recent Lipid Panel No results found for: CHOL, TRIG, HDL, CHOLHDL, VLDL, LDLCALC, LDLDIRECT  Physical Exam:    VS:  BP 136/90 (BP Location: Left Arm, Patient Position: Sitting, Cuff Size: Large)    Pulse 89    Ht  (1.626 m)    Wt 265 lb (120.2 kg)    BMI 45.49 kg/m     Wt Readings from Last 3 Encounters:  12/15/20 265 lb (120.2 kg)  11/26/13 236 lb (107 kg)     GEN: Well nourished, well developed in no acute distress HEENT: Normal NECK: No JVD; No carotid bruits LYMPHATICS: No lymphadenopathy CARDIAC: S1S2 noted,RRR, no murmurs, rubs, gallops RESPIRATORY:  Clear to auscultation without rales, wheezing or rhonchi  ABDOMEN: Soft, non-tender, non-distended, +bowel sounds, no guarding. EXTREMITIES: +1 bilateral leg edema, No cyanosis, no clubbing MUSCULOSKELETAL:  No deformity  SKIN: Warm and dry NEUROLOGIC:  Alert and oriented x 3, non-focal PSYCHIATRIC:  Normal affect, good insight  ASSESSMENT:    1. NSVT (nonsustained ventricular tachycardia)   2. Nonspecific abnormal electrocardiogram (ECG) (EKG)   3. Prediabetes   4. Hypertension, unspecified type   5. Other chest pain   6. Bilateral leg edema   7. Medication management   8. Morbid obesity (HCC)    PLAN:    Finding of NSVT and abnormal EKG s concerning, this patient does have intermediate risk for coronary artery disease and at this time I would like to pursue an ischemic evaluation in this patient.  Shared decision a coronary CTA at this time  is appropriate.  I have discussed with the patient about the testing.  The patient has no IV contrast allergy and is agreeable to proceed with this test.  She does have bilateral leg edema and she takes hydrochlorothiazide as part of her antihypertensive regimen.  I have encouraged the patient to get compression stockings with at least 10 PSI/millimeters mercury pressure which will help given the fact that she is on her feet mostly at work.  For completeness I will like to get an echocardiogram to assess her LV function due to her significant family history of cardiomyopathy.  We will continue to monitor her blood pressure for optimization and keep her less than 130/80.  For now she is monitoring her salt intake.  If needed we will increase her losartan.  The patient understands the need to lose weight with diet and exercise. We have discussed specific strategies for this.  The patient is in agreement with the above plan. The patient left the office in stable condition.  The patient will follow up in 4 months or sooner if needed.  Medication Adjustments/Labs and Tests Ordered: Current medicines are reviewed at length with the patient today.  Concerns regarding medicines are outlined above.  Orders Placed This Encounter  Procedures   CT CORONARY MORPH W/CTA COR W/SCORE W/CA W/CM &/OR WO/CM   Basic Metabolic Panel (BMET)   Magnesium   EKG 12-Lead   ECHOCARDIOGRAM COMPLETE   Meds ordered this encounter  Medications   metoprolol tartrate (LOPRESSOR) 100 MG tablet    Sig: Take 2 hours prior to CT    Dispense:  1 tablet    Refill:  0    Patient Instructions  Medication Instructions:  Your physician recommends that you continue on your current medications as directed. Please refer to the Current Medication list given to you today.  *If you need a refill on your cardiac medications before your next appointment, please call your pharmacy*   Lab Work: Your physician recommends that you  return for lab work in:  TODAY: BMET, Mag If you have labs (blood work) drawn today and your tests are completely normal, you will receive your results only by: MyChart Message (if you have MyChart) OR A paper copy in the mail If you have any lab test that is abnormal or we need to change your treatment, we will call you to review the results.   Testing/Procedures: Your physician has requested that you have an echocardiogram. Echocardiography is a painless test that uses sound waves to create images of your heart. It provides your doctor with information about the size and shape of your heart and how well your hearts chambers and valves are working. This procedure takes approximately one hour. There are no restrictions for this procedure.    Your cardiac CT will be scheduled at one of the below locations:   [facility] [address] [place], [place] [address] [phone]  If scheduled at [facility], please arrive at the [facility] main entrance (entrance A) of [facility] 30 minutes prior to test start time. You can use the FREE valet parking offered at the main entrance (encouraged to control the heart rate for the test) Proceed to the [facility] Radiology Department (first floor) to check-in and test prep.  Please follow these instructions carefully (unless otherwise directed):   On the Night Before the Test: Be sure to Drink plenty of water. Do not consume any caffeinated/decaffeinated beverages or chocolate 12 hours prior to your test. Do not take any antihistamines 12 hours prior to your test.  On the Day of the Test: Drink plenty of water until 1 hour prior to the test. Do not eat any food 4 hours prior to the test. You may take your regular medications prior to the test.  Take metoprolol tartrate (Lopressor) 100 mg two hours prior to test. HOLD Hydrochlorothiazide morning of the test. HOLD Metoprolol succinate 25 mg day of test.   FEMALES- please wear underwire-free bra if available, avoid dresses & tight clothing         After the Test: Drink plenty of water. After receiving IV contrast, you may experience a mild flushed feeling. This is normal. On occasion, you may experience a mild rash up to 24 hours after the test. This is not dangerous. If this occurs, you can take Benadryl 25 mg and increase your fluid intake. If you experience trouble breathing, this can be serious. If it is severe call 911 IMMEDIATELY. If it is mild, please call our office. If you take any of these  medications: Glipizide/Metformin, Avandament, Glucavance, please do not take 48 hours after completing test unless otherwise instructed.  Please allow 2-4 weeks for scheduling of routine cardiac CTs. Some insurance companies require a pre-authorization which may delay scheduling of this test.   For non-scheduling related questions, please contact the cardiac imaging nurse navigator should you have any questions/concerns: Rockwell Alexandria, Cardiac Imaging Nurse Navigator Larey Brick, Cardiac Imaging Nurse Navigator [facility] Direct Office Dial: [phone]   For scheduling needs, including cancellations and rescheduling, please call [place], [phone].    Follow-Up: At [facility], you and your health needs are our priority.  As part of our continuing mission to provide you with exceptional heart care, we have created designated Provider Care Teams.  These Care Teams include your primary Cardiologist (physician) and Advanced Practice Providers (APPs -  Physician Assistants and Nurse Practitioners) who all work together to provide you with the care you need, when you need it.  We recommend signing up for the patient portal called "MyChart".  Sign up information is provided on this After Visit Summary.  MyChart is used to connect with patients for Virtual Visits (Telemedicine).  Patients are able to view lab/test  results, encounter notes, upcoming appointments, etc.  Non-urgent messages can be sent to your provider as well.   To learn more about what you can do with MyChart, go to [email or web address].    Your next appointment:   4 month(s)  The format for your next appointment:   In Person  Provider:   Thomasene Ripple, DO   Other Instructions  PLEASE PURCHASE AND WEAR COMPRESSION STOCKINGS DAILY AND TAKE OFF AT BEDTIME.  I recommend getting support socks/stockings. 10 mmHg is the preferred amount of compression.    Compression stockings are elastic socks that squeeze the legs. They help to increase blood flow to the legs and to decrease swelling in the legs from fluid retention, and reduce the chance of developing blood clots in the lower legs. Please put on in the AM when dressing and off at night when dressing for bed.   [employer]  [address] ([phone]); [place], [place] [address]; [phone]  EMAIL:   [email or web address].   PLEASE MAKE SURE TO ELEVATE YOUR FEET & LEGS ABOVE YOUR HEART WHILE SITTING, THIS WILL HELP WITH THE SWELLING ALSO.    Adopting a Healthy Lifestyle.  Know what a healthy weight is for you (roughly BMI <25) and aim to maintain this   Aim for 7+ servings of fruits and vegetables daily   65-80+ fluid ounces of water or unsweet tea for healthy kidneys   Limit to max 1 drink of alcohol per day; avoid smoking/tobacco   Limit animal fats in diet for cholesterol and heart health - choose grass fed whenever available   Avoid highly processed foods, and foods high in saturated/trans fats   Aim for low stress - take time to unwind and care for your mental health   Aim for 150 min of moderate intensity exercise weekly for heart health, and weights twice weekly for bone health   Aim for 7-9 hours of sleep daily   When it comes to diets, agreement about the perfect plan isnt easy to find, even among the experts. Experts at the [employer] developed an idea known as the Healthy Eating Plate. Just imagine a plate divided into logical, healthy portions.   The emphasis is on diet quality:   Load  up on vegetables and fruits - one-half of your plate: Aim for color and variety, and remember that potatoes dont count.   Go for whole grains - one-quarter of your plate: Whole wheat, barley, wheat berries, quinoa, oats, brown rice, and foods made with them. If you want pasta, go with whole wheat pasta.   Protein power - one-quarter of your plate: Fish, chicken, beans, and nuts are all healthy, versatile protein sources. Limit red meat.   The diet, however, does go beyond the plate, offering a few other suggestions.   Use healthy plant oils, such as olive, canola, soy, corn, sunflower and peanut. Check the labels, and avoid partially hydrogenated oil, which have unhealthy trans fats.   If youre thirsty, drink water. Coffee and tea are good in moderation, but skip sugary drinks and limit milk and dairy products to one or two daily servings.   The type of carbohydrate in the diet is more important than the amount. Some sources of carbohydrates, such as vegetables, fruits, whole grains, and beans-are healthier than others.   Finally, stay active  Signed, Thomasene Ripple, DO  12/15/2020 12:34 PM    [facility]

## 2020-12-15 NOTE — Patient Instructions (Addendum)
Medication Instructions:  Your physician recommends that you continue on your current medications as directed. Please refer to the Current Medication list given to you today.  *If you need a refill on your cardiac medications before your next appointment, please call your pharmacy*   Lab Work: Your physician recommends that you return for lab work in:  TODAY: BMET, Mag If you have labs (blood work) drawn today and your tests are completely normal, you will receive your results only by: MyChart Message (if you have MyChart) OR A paper copy in the mail If you have any lab test that is abnormal or we need to change your treatment, we will call you to review the results.   Testing/Procedures: Your physician has requested that you have an echocardiogram. Echocardiography is a painless test that uses sound waves to create images of your heart. It provides your doctor with information about the size and shape of your heart and how well your hearts chambers and valves are working. This procedure takes approximately one hour. There are no restrictions for this procedure.    Your cardiac CT will be scheduled at one of the below locations:   [facility] [address] [place], [place] [address] [phone]  If scheduled at [facility], please arrive at the [facility] main entrance (entrance A) of [facility] 30 minutes prior to test start time. You can use the FREE valet parking offered at the main entrance (encouraged to control the heart rate for the test) Proceed to the [facility] Radiology Department (first floor) to check-in and test prep.  Please follow these instructions carefully (unless otherwise directed):   On the Night Before the Test: Be sure to Drink plenty of water. Do not consume any caffeinated/decaffeinated beverages or chocolate 12 hours prior to your test. Do not take any antihistamines 12 hours prior to your test.  On the Day of the  Test: Drink plenty of water until 1 hour prior to the test. Do not eat any food 4 hours prior to the test. You may take your regular medications prior to the test.  Take metoprolol tartrate (Lopressor) 100 mg two hours prior to test. HOLD Hydrochlorothiazide morning of the test. HOLD Metoprolol succinate 25 mg day of test.  FEMALES- please wear underwire-free bra if available, avoid dresses & tight clothing         After the Test: Drink plenty of water. After receiving IV contrast, you may experience a mild flushed feeling. This is normal. On occasion, you may experience a mild rash up to 24 hours after the test. This is not dangerous. If this occurs, you can take Benadryl 25 mg and increase your fluid intake. If you experience trouble breathing, this can be serious. If it is severe call 911 IMMEDIATELY. If it is mild, please call our office. If you take any of these medications: Glipizide/Metformin, Avandament, Glucavance, please do not take 48 hours after completing test unless otherwise instructed.  Please allow 2-4 weeks for scheduling of routine cardiac CTs. Some insurance companies require a pre-authorization which may delay scheduling of this test.   For non-scheduling related questions, please contact the cardiac imaging nurse navigator should you have any questions/concerns: Rockwell Alexandria, Cardiac Imaging Nurse Navigator Larey Brick, Cardiac Imaging Nurse Navigator [facility] Direct Office Dial: [phone]   For scheduling needs, including cancellations and rescheduling, please call [place], [phone].    Follow-Up: At [facility], you and your health  needs are our priority.  As part of our continuing mission to provide you with exceptional heart care, we have created designated Provider Care Teams.  These Care Teams include your primary Cardiologist (physician) and Advanced Practice Providers (APPs -  Physician Assistants and Nurse  Practitioners) who all work together to provide you with the care you need, when you need it.  We recommend signing up for the patient portal called "MyChart".  Sign up information is provided on this After Visit Summary.  MyChart is used to connect with patients for Virtual Visits (Telemedicine).  Patients are able to view lab/test results, encounter notes, upcoming appointments, etc.  Non-urgent messages can be sent to your provider as well.   To learn more about what you can do with MyChart, go to [email or web address].    Your next appointment:   4 month(s)  The format for your next appointment:   In Person  Provider:   Thomasene Ripple, DO   Other Instructions  PLEASE PURCHASE AND WEAR COMPRESSION STOCKINGS DAILY AND TAKE OFF AT BEDTIME.  I recommend getting support socks/stockings. 10 mmHg is the preferred amount of compression.    Compression stockings are elastic socks that squeeze the legs. They help to increase blood flow to the legs and to decrease swelling in the legs from fluid retention, and reduce the chance of developing blood clots in the lower legs. Please put on in the AM when dressing and off at night when dressing for bed.   [employer];  [address] ([phone]); [place], [place] [address]; [phone]  EMAIL:   [email or web address].   PLEASE MAKE SURE TO ELEVATE YOUR FEET & LEGS ABOVE YOUR HEART WHILE SITTING, THIS WILL HELP WITH THE SWELLING ALSO.

## 2020-12-16 LAB — BASIC METABOLIC PANEL
BUN/Creatinine Ratio: 12 (ref 9–23)
BUN: 14 mg/dL (ref 6–24)
CO2: 26 mmol/L (ref 20–29)
Calcium: 9.8 mg/dL (ref 8.7–10.2)
Chloride: 103 mmol/L (ref 96–106)
Creatinine, Ser: 1.16 mg/dL — ABNORMAL HIGH (ref 0.57–1.00)
Glucose: 84 mg/dL (ref 70–99)
Potassium: 4.2 mmol/L (ref 3.5–5.2)
Sodium: 140 mmol/L (ref 134–144)
eGFR: 59 mL/min/{1.73_m2} — ABNORMAL LOW (ref >59)

## 2020-12-16 LAB — MAGNESIUM: Magnesium: 1.9 mg/dL (ref 1.6–2.3)

## 2020-12-30 ENCOUNTER — Ambulatory Visit (INDEPENDENT_AMBULATORY_CARE_PROVIDER_SITE_OTHER): Payer: BC Managed Care – PPO

## 2020-12-30 ENCOUNTER — Other Ambulatory Visit: Payer: Self-pay

## 2020-12-30 DIAGNOSIS — R600 Localized edema: Secondary | ICD-10-CM

## 2020-12-30 LAB — ECHOCARDIOGRAM COMPLETE
Area-P 1/2: 4.29 cm2
S' Lateral: 3 cm

## 2020-12-31 ENCOUNTER — Telehealth: Payer: Self-pay | Admitting: Cardiology

## 2020-12-31 NOTE — Telephone Encounter (Signed)
Follow Up:      Patient is returning [place] 's call , concerning her results.

## 2020-12-31 NOTE — Telephone Encounter (Signed)
Spoke with pt, see chart.

## 2021-01-04 ENCOUNTER — Telehealth (HOSPITAL_COMMUNITY): Payer: Self-pay | Admitting: Emergency Medicine

## 2021-01-04 NOTE — Telephone Encounter (Signed)
Reaching out to patient to offer assistance regarding upcoming cardiac imaging study; pt verbalizes understanding of appt date/time, parking situation and where to check in, pre-test NPO status and medications ordered, and verified current allergies; name and call back number provided for further questions should they arise Tammy Bond RN Navigator Cardiac Imaging [facility] [phone] office [phone] cell  Denies iv issues 100mg  metoprolol tart  Arrival 9:00a

## 2021-01-05 ENCOUNTER — Encounter (HOSPITAL_COMMUNITY): Payer: Self-pay

## 2021-01-05 ENCOUNTER — Other Ambulatory Visit: Payer: Self-pay

## 2021-01-05 ENCOUNTER — Ambulatory Visit (HOSPITAL_COMMUNITY)
Admission: RE | Admit: 2021-01-05 | Discharge: 2021-01-05 | Disposition: A | Discharge status: Home / Self Care | Payer: BC Managed Care – PPO | Source: Ambulatory Visit | Attending: Cardiology | Admitting: Cardiology

## 2021-01-05 DIAGNOSIS — R0789 Other chest pain: Secondary | ICD-10-CM

## 2021-01-05 MED ORDER — METOPROLOL TARTRATE 5 MG/5ML IV SOLN: 5.0000 mg | INTRAVENOUS | Status: DC | PRN

## 2021-01-05 MED ORDER — NITROGLYCERIN 0.4 MG SL SUBL: 0.8000 mg | SUBLINGUAL_TABLET | Freq: Once | SUBLINGUAL | Status: DC

## 2021-01-05 MED ORDER — NITROGLYCERIN 0.4 MG SL SUBL
SUBLINGUAL_TABLET | SUBLINGUAL | Status: AC
  Filled 2021-01-05: qty 2

## 2021-01-05 MED ORDER — IOHEXOL 350 MG/ML SOLN
100.0000 mL | Freq: Once | INTRAVENOUS | Status: AC | PRN
  Administered 2021-01-05: 95 mL via INTRAVENOUS

## 2021-01-19 ENCOUNTER — Telehealth: Payer: Self-pay | Admitting: Cardiology

## 2021-01-19 DIAGNOSIS — Z79899 Other long term (current) drug therapy: Secondary | ICD-10-CM

## 2021-01-19 MED ORDER — METOPROLOL SUCCINATE ER 25 MG PO TB24: ORAL_TABLET | ORAL | 3 refills | Status: DC

## 2021-01-19 NOTE — Telephone Encounter (Signed)
Returned call to patient and made patient aware of Dr. Terrial Rhodes recommendations. Patient will go tomorrow for [employer] and Mag at [place] office. Prescription for Metoprolol Succinate 25mg  in the morning and 12.5mg  in the evening sent to patient preferred pharmacy.   Advised patient to call back to office with any issues, questions, or concerns. Patient verbalized understanding.

## 2021-01-19 NOTE — Addendum Note (Signed)
Addended by: Bea Laura B on: 01/19/2021 01:55 PM   Modules accepted: Orders

## 2021-01-19 NOTE — Telephone Encounter (Signed)
Patient c/o Palpitations:  High priority if patient c/o lightheadedness, shortness of breath, or chest pain  How long have you had palpitations/irregular HR/ Afib? Are you having the symptoms now? no  Are you currently experiencing lightheadedness, SOB or CP? no  Do you have a history of afib (atrial fibrillation) or irregular heart rhythm?   Have you checked your BP or HR? (document readings if available): no  Are you experiencing any other symptoms? No  Patient called in to say that she is still feeling the papalation

## 2021-01-19 NOTE — Telephone Encounter (Signed)
Returned call to patient who states that she has had a few episodes of palpitations with the last being this morning. Patient states that the episode only lasted for less than a min. Patient had her vitals done at work as follows HR 94, BP 121/81. Patient denies any chest pain , shortness of breath, or lightheadedness and states at present she feels well. Patient does state since going on the lower dose of HCTZ in the losartan combination  and her HCTZ being decreased to 12.5mg  daily from 25mg  that her swelling in her legs is much worse. Patient unable to tell if she gained weight as she does not weigh daily. Patient states the compression stockings helped but she needs to buy another pair. Advised patient that I would forward message to Dr. for her to review and advise. Patient verbalized understanding.

## 2021-01-19 NOTE — Telephone Encounter (Signed)
Please ask her to get blood work at the [employer] office for bmp and mg. I think the change was made with her pcp with her HCTZ. I can make recommendation after I see her recent labs. For the palpitations increase toprol xl to 25 mg in the am and 12.5mg  at night

## 2021-04-18 ENCOUNTER — Ambulatory Visit: Payer: BC Managed Care – PPO | Admitting: Cardiology

## 2021-04-18 ENCOUNTER — Encounter: Payer: Self-pay | Admitting: Cardiology

## 2021-04-18 VITALS — BP 144/96 | HR 62 | Ht 65.0 in | Wt 268.0 lb

## 2021-04-18 DIAGNOSIS — N183 Chronic kidney disease, stage 3 unspecified: Secondary | ICD-10-CM

## 2021-04-18 DIAGNOSIS — Z79899 Other long term (current) drug therapy: Secondary | ICD-10-CM | POA: Diagnosis not present

## 2021-04-18 DIAGNOSIS — R7303 Prediabetes: Secondary | ICD-10-CM

## 2021-04-18 DIAGNOSIS — I10 Essential (primary) hypertension: Secondary | ICD-10-CM

## 2021-04-18 LAB — COMPREHENSIVE METABOLIC PANEL
ALT: 18 IU/L (ref 0–32)
AST: 16 IU/L (ref 0–40)
Albumin/Globulin Ratio: 1.3 (ref 1.2–2.2)
Albumin: 4 g/dL (ref 3.8–4.8)
Alkaline Phosphatase: 93 IU/L (ref 44–121)
BUN/Creatinine Ratio: 14 (ref 9–23)
BUN: 17 mg/dL (ref 6–24)
Bilirubin Total: 0.3 mg/dL (ref 0.0–1.2)
CO2: 23 mmol/L (ref 20–29)
Calcium: 9.6 mg/dL (ref 8.7–10.2)
Chloride: 102 mmol/L (ref 96–106)
Creatinine, Ser: 1.19 mg/dL — ABNORMAL HIGH (ref 0.57–1.00)
Globulin, Total: 3.2 g/dL (ref 1.5–4.5)
Glucose: 86 mg/dL (ref 70–99)
Potassium: 4 mmol/L (ref 3.5–5.2)
Sodium: 141 mmol/L (ref 134–144)
Total Protein: 7.2 g/dL (ref 6.0–8.5)
eGFR: 57 mL/min/{1.73_m2} — ABNORMAL LOW (ref >59)

## 2021-04-18 LAB — MAGNESIUM: Magnesium: 1.9 mg/dL (ref 1.6–2.3)

## 2021-04-18 MED ORDER — HYDROCHLOROTHIAZIDE 25 MG PO TABS: 25.0000 mg | ORAL_TABLET | Freq: Every day | ORAL | 3 refills | Status: DC

## 2021-04-18 MED ORDER — LOSARTAN POTASSIUM 25 MG PO TABS
25.0000 mg | ORAL_TABLET | Freq: Every day | ORAL | 3 refills | Status: AC
Start: 2021-04-18 — End: ?

## 2021-04-18 MED ORDER — METOPROLOL SUCCINATE ER 25 MG PO TB24: 25.0000 mg | ORAL_TABLET | Freq: Every day | ORAL | 3 refills | Status: DC

## 2021-04-18 NOTE — Patient Instructions (Addendum)
Medication Instructions:  ?Your physician has recommended you make the following change in your medication:  ?STOP: Evening dose of Metoprolol  ?DECREASE: Losartan 25 mg once daily ?INCREASE: Hydrochlorothiazide 25 mg once daily ?START: Metoprolol 25 mg once daily ? ?Please take your blood pressure daily for 2 weeks and send in a MyChart message. Please include heart rates.  ? ?HOW TO TAKE YOUR BLOOD PRESSURE: ?Rest 5 minutes before taking your blood pressure. ?Don?t smoke or drink caffeinated beverages for at least 30 minutes before. ?Take your blood pressure before (not after) you eat. ?Sit comfortably with your back supported and both feet on the floor (don?t cross your legs). ?Elevate your arm to heart level on a table or a desk. ?Use the proper sized cuff. It should fit smoothly and snugly around your bare upper arm. There should be enough room to slip a fingertip under the cuff. The bottom edge of the cuff should be 1 inch above the crease of the elbow. ?Ideally, take 3 measurements at one sitting and record the average. ? ?*If you need a refill on your cardiac medications before your next appointment, please call your pharmacy* ? ? ?Lab Work: ?Your physician recommends that you return for lab work in:  ?TODAY: CMET, Mag ?If you have labs (blood work) drawn today and your tests are completely normal, you will receive your results only by: ?MyChart Message (if you have MyChart) OR ?A paper copy in the mail ?If you have any lab test that is abnormal or we need to change your treatment, we will call you to review the results. ? ? ?Testing/Procedures: ?None ? ? ?Follow-Up: ?At [facility], you and your health needs are our priority.  As part of our continuing mission to provide you with exceptional heart care, we have created designated Provider Care Teams.  These Care Teams include your primary Cardiologist (physician) and Advanced Practice Providers (APPs -  Physician Assistants and Nurse Practitioners) who  all work together to provide you with the care you need, when you need it. ? ?We recommend signing up for the patient portal called "MyChart".  Sign up information is provided on this After Visit Summary.  MyChart is used to connect with patients for Virtual Visits (Telemedicine).  Patients are able to view lab/test results, encounter notes, upcoming appointments, etc.  Non-urgent messages can be sent to your provider as well.   ?To learn more about what you can do with MyChart, go to [email or web address].   ? ?Your next appointment:   ?6 month(s) ? ?The format for your next appointment:   ?Virtual Visit  ? ?Provider:   ?Thomasene Ripple, DO   ? ? ?Other Instructions ? ? ?Important Information About Sugar ? ? ? ? ?  ?

## 2021-04-18 NOTE — Progress Notes (Signed)
?Cardiology Office Note:   ? ?Date:  04/18/2021  ? ?ID:  Xiadani Damman, DOB Oct 05, 1975, MRN [id number] ? ?PCP:  Abner Greenspan, MD  ?Cardiologist:  Thomasene Ripple, DO  ?Electrophysiologist:  None  ? ?Referring MD: Abner Greenspan, MD  ? ?" I am having swollen" ? ?History of Present Illness:   ? ?Tammy Gamble is a 46 y.o. female with a hx of hypertension, prediabetes, hyperlipidemia, NSVT seen on the monitor, chronic kidney disease here today for follow-up visit. ? ?At her last visit in December 2022 at that time she had episodes of NSVT on her monitor and had been started on metoprolol by her primary doctor.  During her visit she was experiencing significant bilateral leg edema I consider hydrochlorothiazide encouraged the patient to wear compression stocking.  We continued her losartan as well. ? ?In the meantime her medication has been adjusted. ? ?Today she tells me that she is experiencing significant leg swelling.  She also notes that she was having lower heart rate so she stopped taking the evening Lopressor.  No other complaints at this time. ? ?Past Medical History:  ?Diagnosis Date  ? Anemia   ? Iron deficiency  ? Hypertension   ? Obesity   ? ? ?Past Surgical History:  ?Procedure Laterality Date  ? CESAREAN SECTION    ? 1998 & 2011  ? ? ?Current Medications: ?Current Meds  ?Medication Sig  ? hydrochlorothiazide (HYDRODIURIL) 25 MG tablet Take 1 tablet (25 mg total) by mouth daily.  ? losartan (COZAAR) 25 MG tablet Take 1 tablet (25 mg total) by mouth daily.  ? metoprolol succinate (TOPROL XL) 25 MG 24 hr tablet Take 1 tablet (25 mg total) by mouth daily.  ? rosuvastatin (CRESTOR) 5 MG tablet Take 5 mg by mouth 2 (two) times a week.  ? [DISCONTINUED] losartan-hydrochlorothiazide (HYZAAR) 50-12.5 MG tablet Take 1 tablet by mouth daily.  ? [DISCONTINUED] metoprolol succinate (TOPROL-XL) 25 MG 24 hr tablet Take 25mg  (1) Tablet in the Morning, and 12.5mg  (1/2) Tablet in the evening. (Patient taking differently: 25  mg daily.)  ?  ? ?Allergies:   Sulfa antibiotics  ? ?Social History  ? ?Socioeconomic History  ? Marital status: Married  ?  Spouse name: Not on file  ? Number of children: Not on file  ? Years of education: Not on file  ? Highest education level: Not on file  ?Occupational History  ? Not on file  ?Tobacco Use  ? Smoking status: Never  ? Smokeless tobacco: Not on file  ?Substance and Sexual Activity  ? Alcohol use: No  ?  Alcohol/week: 0.0 standard drinks  ? Drug use: No  ? Sexual activity: Not on file  ?Other Topics Concern  ? Not on file  ?Social History Narrative  ? Married  ? 2 children  ? ?Social Determinants of Health  ? ?Financial Resource Strain: Not on file  ?Food Insecurity: Not on file  ?Transportation Needs: Not on file  ?Physical Activity: Not on file  ?Stress: Not on file  ?Social Connections: Not on file  ?  ? ?Family History: ?The patient's family history includes Breast cancer in her paternal aunt, paternal grandmother, and paternal great-grandmother; Cancer in her paternal grandfather; Diabetes in her maternal grandmother; Hyperlipidemia in her maternal grandmother; Hypertension in her maternal grandmother. ? ?ROS:   ?Review of Systems  ?Constitution: Negative for decreased appetite, fever and weight gain.  ?HENT: Negative for congestion, ear discharge, hoarse voice and sore throat.   ?Eyes: Negative  for discharge, redness, vision loss in right eye and visual halos.  ?Cardiovascular: Negative for chest pain, dyspnea on exertion, leg swelling, orthopnea and palpitations.  ?Respiratory: Negative for cough, hemoptysis, shortness of breath and snoring.   ?Endocrine: Negative for heat intolerance and polyphagia.  ?Hematologic/Lymphatic: Negative for bleeding problem. Does not bruise/bleed easily.  ?Skin: Negative for flushing, nail changes, rash and suspicious lesions.  ?Musculoskeletal: Negative for arthritis, joint pain, muscle cramps, myalgias, neck pain and stiffness.  ?Gastrointestinal: Negative  for abdominal pain, bowel incontinence, diarrhea and excessive appetite.  ?Genitourinary: Negative for decreased libido, genital sores and incomplete emptying.  ?Neurological: Negative for brief paralysis, focal weakness, headaches and loss of balance.  ?Psychiatric/Behavioral: Negative for altered mental status, depression and suicidal ideas.  ?Allergic/Immunologic: Negative for HIV exposure and persistent infections.  ? ? ?EKGs/Labs/Other Studies Reviewed:   ? ?The following studies were reviewed today: ? ? ?EKG:  None today  ? ?CCTA 01/05/2021 ?Aorta: Normal size.  No calcifications.  No dissection. ?  ?Aortic Valve:  Trileaflet.  No calcifications. ?  ?Coronary Arteries:  Normal coronary origin.  Right dominance. ?  ?RCA is a large dominant artery that gives rise to PDA and PLA. There ?is no plaque. ?  ?Left main is a large artery that gives rise to LAD and LCX arteries. ?  ?LAD is a large vessel that has no plaque. ?  ?LCX is a non-dominant artery that gives rise to one large OM1 ?branch. There is no plaque. ?  ?Coronary Calcium Score: ?  ?Left main: 0 ?  ?Left anterior descending artery: 0 ?  ?Left circumflex artery: 0 ?  ?Right coronary artery: 0 ?  ?Total: 0 ?  ?Percentile: 0 ?  ?Other findings: ?  ?Normal pulmonary vein drainage into the left atrium. ?  ?Normal left atrial appendage without a thrombus. ?  ?Normal size of the pulmonary artery. ?  ?IMPRESSION: ?1. Coronary calcium score of 0. This was 0 percentile for age and ?sex matched control. ?  ?2. Normal coronary origin with right dominance. ?  ?3. No evidence of CAD. CAD-RADS 0. No evidence of CAD (0%). Consider ?non-atherosclerotic causes of chest pain. ?  ?  ?Electronically Signed ?  By: Thomasene Ripple D.O. ?  On: 01/05/2021 12:59 ? ? ?TTE 12/2020 ?IMPRESSIONS  ? ? ? 1. Left ventricular ejection fraction, by estimation, is 60 to 65%. The  ?left ventricle has normal function. The left ventricle has no regional  ?wall motion abnormalities. Left  ventricular diastolic parameters are  ?consistent with Grade I diastolic  ?dysfunction (impaired relaxation).  ? 2. Right ventricular systolic function is normal. The right ventricular  ?size is normal. There is normal pulmonary artery systolic pressure.  ? 3. The mitral valve is normal in structure. No evidence of mitral valve  ?regurgitation. No evidence of mitral stenosis.  ? 4. The aortic valve is normal in structure. Aortic valve regurgitation is  ?not visualized. No aortic stenosis is present.  ? 5. The inferior vena cava is normal in size with greater than 50%  ?respiratory variability, suggesting right atrial pressure of 3 mmHg.  ? ?FINDINGS  ? Left Ventricle: Left ventricular ejection fraction, by estimation, is 60  ?to 65%. The left ventricle has normal function. The left ventricle has no  ?regional wall motion abnormalities. The left ventricular internal cavity  ?size was normal in size. There is  ? no left ventricular hypertrophy. Left ventricular diastolic parameters  ?are consistent with Grade I diastolic dysfunction (  impaired relaxation).  ? ?Right Ventricle: The right ventricular size is normal. No increase in  ?right ventricular wall thickness. Right ventricular systolic function is  ?normal. There is normal pulmonary artery systolic pressure. The tricuspid  ?regurgitant velocity is 2.39 m/s, and  ? with an assumed right atrial pressure of 3 mmHg, the estimated right  ?ventricular systolic pressure is 25.8 mmHg.  ? ?Left Atrium: Left atrial size was normal in size.  ? ?Right Atrium: Right atrial size was normal in size.  ? ?Pericardium: There is no evidence of pericardial effusion.  ? ?Mitral Valve: The mitral valve is normal in structure. No evidence of  ?mitral valve regurgitation. No evidence of mitral valve stenosis.  ? ?Tricuspid Valve: The tricuspid valve is normal in structure. Tricuspid  ?valve regurgitation is not demonstrated. No evidence of tricuspid  ?stenosis.  ? ?Aortic Valve: The  aortic valve is normal in structure. Aortic valve  ?regurgitation is not visualized. No aortic stenosis is present.  ? ?Pulmonic Valve: The pulmonic valve was normal in structure. Pulmonic valve  ?regurgitation is not

## 2021-04-19 ENCOUNTER — Encounter: Payer: Self-pay | Admitting: Cardiology

## 2021-04-21 ENCOUNTER — Other Ambulatory Visit: Payer: Self-pay

## 2021-04-21 DIAGNOSIS — N183 Chronic kidney disease, stage 3 unspecified: Secondary | ICD-10-CM

## 2021-04-21 DIAGNOSIS — Z7689 Persons encountering health services in other specified circumstances: Secondary | ICD-10-CM

## 2021-04-21 NOTE — Progress Notes (Signed)
Referral placed per Dr.Tobb's request.  

## 2021-04-21 NOTE — Progress Notes (Signed)
Referral made per Dr. Tobb's request 

## 2021-07-20 ENCOUNTER — Telehealth: Payer: Self-pay | Admitting: Cardiology

## 2021-07-20 ENCOUNTER — Other Ambulatory Visit: Payer: Self-pay

## 2021-07-20 DIAGNOSIS — N183 Chronic kidney disease, stage 3 unspecified: Secondary | ICD-10-CM

## 2021-07-20 NOTE — Telephone Encounter (Signed)
Patient stated her GI needs a clearance for colonoscopy in September. Gave her our fax number to give to her GI. Patient wants the nephrology referral to change to Dr. Chuck Hint. Done. Patient stated that while a [employer] in [facility], she nearly passed out "from the heat." She drank an electrolyte beverage and felt better. She saw PCP this past Monday and had blood work. She also reports that she feels "something" in her chest when her watch shows heart rate of 120. She denies sob or lightheadedness when this occurs. These episodes are on and off, but are occurring more frequently. She stated she was afraid to take the 12.76m met succ in the evenings because her heart rate dropped to 50 and she is scared her heart will stop. The evening dose was d/c in April. Patient's concern is irregular beats.

## 2021-07-20 NOTE — Telephone Encounter (Signed)
Dr. Servando Salina, do you want Zio and for how long?

## 2021-07-20 NOTE — Telephone Encounter (Signed)
Pt would like for nurse to return call regarding upcoming procedure that she is scheduled to have on 09/02/21. Please advise

## 2021-07-21 ENCOUNTER — Other Ambulatory Visit: Payer: Self-pay

## 2021-07-21 ENCOUNTER — Ambulatory Visit (INDEPENDENT_AMBULATORY_CARE_PROVIDER_SITE_OTHER): Payer: BC Managed Care – PPO

## 2021-07-21 DIAGNOSIS — R002 Palpitations: Secondary | ICD-10-CM

## 2021-07-21 DIAGNOSIS — R9431 Abnormal electrocardiogram [ECG] [EKG]: Secondary | ICD-10-CM

## 2021-07-21 NOTE — Telephone Encounter (Signed)
Monitor ordered for two weeks.   Thanks!

## 2021-07-21 NOTE — Progress Notes (Unsigned)
Enrolled patient for a 14 day Zio XT  monitor to be mailed to patients home  °

## 2021-07-21 NOTE — Telephone Encounter (Addendum)
Patient informed of Zio patch for 2 weeks. She stated she wore one within the past year. I explained that since she was concerned about irregular rhythms and unsure of taking medications, Dr. Servando Salina wanted to check for any abnormal heart beats/rhythms. Patient agreed to wear monitor. Reviewed procedure of placing monitor, pressing the button, and using the diary. Patient informed to check Mychart for the instructions. Order placed.

## 2021-07-24 DIAGNOSIS — R002 Palpitations: Secondary | ICD-10-CM | POA: Diagnosis not present

## 2021-08-05 ENCOUNTER — Telehealth: Payer: Self-pay

## 2021-08-05 NOTE — Telephone Encounter (Signed)
Patient returned my call. She has been wearing her monitor. She said that lately that the adhesive is coming loose. She contacted [employer] and they told her what to do. She said the orange light is not blinking and there is contact of the device on her skin. She will complete the monitoring this Sunday.

## 2021-08-05 NOTE — Telephone Encounter (Signed)
Received a message that patient has not read the Zio monitor information I sent to MyChart. LMTCB

## 2021-08-09 ENCOUNTER — Other Ambulatory Visit: Payer: Self-pay | Admitting: Family Medicine

## 2021-08-09 DIAGNOSIS — Z1231 Encounter for screening mammogram for malignant neoplasm of breast: Secondary | ICD-10-CM

## 2021-08-16 ENCOUNTER — Telehealth: Payer: Self-pay | Admitting: Cardiology

## 2021-08-16 NOTE — Telephone Encounter (Signed)
Patient called to follow-up on the results of the ZIO monitor.

## 2021-08-16 NOTE — Telephone Encounter (Signed)
Returned call to patient, advised once reviewed by MD will call with results and/or recommendations.

## 2021-08-19 NOTE — Telephone Encounter (Signed)
Patient informed of Reply from Dr. Servando Salina.Marland KitchenMarland Kitchen"You have very rare skipped beats that may be causing your symptoms.  If you are experiencing more symptoms we can consider adjusting her beta-blocker."  Patient has occasional "uncomfortable fluttering" in her chest. She denies sob and lightheadedness.  Patient stated she is afraid to take more of the metoprolol because it may lower her heart rate too much.  Discussed with patient how the pulse can fluctuate throughout the day and gave her parameters for P and BP. Encouraged her to try an adjusted dose of metoprolol if it is prescribed.

## 2021-08-19 NOTE — Telephone Encounter (Signed)
Patient is following up, again requesting to discuss results. 

## 2021-10-24 ENCOUNTER — Ambulatory Visit
Admission: RE | Admit: 2021-10-24 | Discharge: 2021-10-24 | Disposition: A | Discharge status: Home / Self Care | Payer: BC Managed Care – PPO | Source: Ambulatory Visit | Attending: Family Medicine | Admitting: Family Medicine

## 2021-10-24 DIAGNOSIS — Z1231 Encounter for screening mammogram for malignant neoplasm of breast: Secondary | ICD-10-CM

## 2022-01-04 ENCOUNTER — Ambulatory Visit: Payer: BC Managed Care – PPO | Admitting: Podiatry

## 2022-01-04 ENCOUNTER — Ambulatory Visit (INDEPENDENT_AMBULATORY_CARE_PROVIDER_SITE_OTHER): Payer: BC Managed Care – PPO

## 2022-01-04 ENCOUNTER — Encounter: Payer: Self-pay | Admitting: Podiatry

## 2022-01-04 DIAGNOSIS — M778 Other enthesopathies, not elsewhere classified: Secondary | ICD-10-CM | POA: Diagnosis not present

## 2022-01-04 DIAGNOSIS — M84374A Stress fracture, right foot, initial encounter for fracture: Secondary | ICD-10-CM | POA: Diagnosis not present

## 2022-01-04 NOTE — Progress Notes (Signed)
  Subjective:  Patient ID: Tammy Gamble, female    DOB: 28-Dec-1975,   MRN: [id number]  Chief Complaint  Patient presents with   Foot Pain    Right foot pain across the middle of foot on top. No injuries. Pain started December 29,2023. Pain constant but sometimes pain is not bad. Pain is worst at night time.     47 y.o. female presents for concern of right top of the foot pain that started 6 days ago. Relates pain is mostly when putting pressure on the foot although does hurt and throb at night. States she has tried to rest and has taken tylenol with some relief. Relates the day before it started hurting she had been very active on her feet. Denies any injury. . Denies any other pedal complaints. Denies n/v/f/c.   Past Medical History:  Diagnosis Date   Anemia    Iron deficiency   Hypertension    Obesity     Objective:  Physical Exam: Vascular: DP/PT pulses 2/4 bilateral. CFT <3 seconds. Normal hair growth on digits. No edema.  Skin. No lacerations or abrasions bilateral feet.  Musculoskeletal: MMT 5/5 bilateral lower extremities in DF, PF, Inversion and Eversion. Deceased ROM in DF of ankle joint. Tender over base of third metatarsal area dorsally. Mild edema no erythema noted. No pain with ROM of the TMTJ. No pain to other metatarsals.  Neurological: Sensation intact to light touch.   Assessment:   1. Stress reaction of right foot, initial encounter   2. Capsulitis of foot, right      Plan:  Patient was evaluated and treated and all questions answered. X-rays reviewed and discussed with patient. No acute fractures or dislocations noted.  Degenerative changes noted to midfoot.  -Xrays reviewed -Discussed treatement options for stress fracture vs capsulitis ; risks, alternatives, and benefits explained. -Dispensed CAM boot. Patient to wear at all times and instructed on use -Recommend protection, rest, ice, elevation daily until symptoms improve -Rx pain  med/antinflammatories as needed -Patient to return to office in 4 weeks for serial x-rays to assess healing  or sooner if condition worsens.   Lorenda Peck, DPM

## 2022-01-23 ENCOUNTER — Ambulatory Visit
Admission: RE | Admit: 2022-01-23 | Discharge: 2022-01-23 | Disposition: A | Discharge status: Home / Self Care | Payer: BC Managed Care – PPO | Source: Ambulatory Visit | Attending: Family Medicine | Admitting: Family Medicine

## 2022-01-23 ENCOUNTER — Other Ambulatory Visit: Payer: Self-pay | Admitting: Family Medicine

## 2022-01-23 DIAGNOSIS — R1032 Left lower quadrant pain: Secondary | ICD-10-CM

## 2022-01-31 DIAGNOSIS — M79676 Pain in unspecified toe(s): Secondary | ICD-10-CM

## 2022-02-01 ENCOUNTER — Ambulatory Visit: Payer: BC Managed Care – PPO | Admitting: Podiatry

## 2022-02-01 ENCOUNTER — Encounter: Payer: Self-pay | Admitting: Podiatry

## 2022-02-01 ENCOUNTER — Ambulatory Visit: Payer: BC Managed Care – PPO

## 2022-02-01 DIAGNOSIS — M84374D Stress fracture, right foot, subsequent encounter for fracture with routine healing: Secondary | ICD-10-CM | POA: Diagnosis not present

## 2022-02-01 NOTE — Progress Notes (Signed)
  Subjective:  Patient ID: Tammy Gamble, female    DOB: September 12, 1975,   MRN: [id number]  Chief Complaint  Patient presents with   Fracture    Follow up. Patient states that she has been doing pretty good since her last visit. 4/10 pain level.    47 y.o. female presents for follow-up of right foot stress fracture. Relates she has been doing better and the boot has helped and has even been able to walk with out the boot without too much pain. Relates it has still be sore but doing better.  Denies any injury. . Denies any other pedal complaints. Denies n/v/f/c.   Past Medical History:  Diagnosis Date   Anemia    Iron deficiency   Hypertension    Obesity     Objective:  Physical Exam: Vascular: DP/PT pulses 2/4 bilateral. CFT <3 seconds. Normal hair growth on digits. No edema.  Skin. No lacerations or abrasions bilateral feet.  Musculoskeletal: MMT 5/5 bilateral lower extremities in DF, PF, Inversion and Eversion. Deceased ROM in DF of ankle joint. Mildly tender over base of third metatarsal area dorsally. Mild edema no erythema noted. No pain with ROM of the TMTJ. No pain to other metatarsals.  Neurological: Sensation intact to light touch.   Assessment:   1. Stress reaction of right foot with routine healing, subsequent encounter      Plan:  Patient was evaluated and treated and all questions answered. X-rays reviewed and discussed with patient. No acute fractures or dislocations noted.  Degenerative changes noted to midfoot.  -Xrays reviewed No significant changes noted.  -Discussed treatement options for stress fracture vs capsulitis ; risks, alternatives, and benefits explained. -Continue CAM boot for next two weeks and begin to taper out of it into a stiff supportive shoes. . Patient to wear at all times and instructed on use -Recommend protection, rest, ice, elevation daily until symptoms improve -Rx pain med/antinflammatories as needed -Patient to return to office in 4  weeks for serial x-rays to assess healing  or sooner if condition worsens.   Lorenda Peck, DPM

## 2022-02-10 ENCOUNTER — Encounter: Payer: Self-pay | Admitting: Gastroenterology

## 2022-03-21 ENCOUNTER — Ambulatory Visit: Payer: BC Managed Care – PPO | Admitting: Gastroenterology

## 2022-04-13 DIAGNOSIS — I10 Essential (primary) hypertension: Secondary | ICD-10-CM | POA: Diagnosis not present

## 2022-04-13 DIAGNOSIS — E785 Hyperlipidemia, unspecified: Secondary | ICD-10-CM | POA: Diagnosis not present

## 2022-04-13 DIAGNOSIS — R7303 Prediabetes: Secondary | ICD-10-CM | POA: Diagnosis not present

## 2022-05-02 DIAGNOSIS — Z1211 Encounter for screening for malignant neoplasm of colon: Secondary | ICD-10-CM | POA: Diagnosis not present

## 2022-05-02 DIAGNOSIS — N189 Chronic kidney disease, unspecified: Secondary | ICD-10-CM | POA: Diagnosis not present

## 2022-05-02 DIAGNOSIS — K635 Polyp of colon: Secondary | ICD-10-CM | POA: Diagnosis not present

## 2022-05-02 DIAGNOSIS — D122 Benign neoplasm of ascending colon: Secondary | ICD-10-CM | POA: Diagnosis not present

## 2022-05-02 DIAGNOSIS — I129 Hypertensive chronic kidney disease with stage 1 through stage 4 chronic kidney disease, or unspecified chronic kidney disease: Secondary | ICD-10-CM | POA: Diagnosis not present

## 2022-05-02 DIAGNOSIS — D124 Benign neoplasm of descending colon: Secondary | ICD-10-CM | POA: Diagnosis not present

## 2022-05-02 DIAGNOSIS — K644 Residual hemorrhoidal skin tags: Secondary | ICD-10-CM | POA: Diagnosis not present

## 2022-06-05 ENCOUNTER — Other Ambulatory Visit: Payer: Self-pay

## 2022-06-05 MED ORDER — METOPROLOL SUCCINATE ER 25 MG PO TB24: 25.0000 mg | ORAL_TABLET | Freq: Every day | ORAL | 0 refills | Status: DC

## 2022-06-12 ENCOUNTER — Other Ambulatory Visit: Payer: Self-pay

## 2022-06-12 MED ORDER — HYDROCHLOROTHIAZIDE 25 MG PO TABS: 25.0000 mg | ORAL_TABLET | Freq: Every day | ORAL | 0 refills | Status: DC

## 2022-07-07 ENCOUNTER — Other Ambulatory Visit: Payer: Self-pay

## 2022-07-07 MED ORDER — METOPROLOL SUCCINATE ER 25 MG PO TB24: 25.0000 mg | ORAL_TABLET | Freq: Every day | ORAL | 0 refills | Status: DC

## 2022-08-01 ENCOUNTER — Telehealth: Payer: Self-pay | Admitting: Cardiology

## 2022-08-01 ENCOUNTER — Other Ambulatory Visit: Payer: Self-pay

## 2022-08-01 MED ORDER — METOPROLOL SUCCINATE ER 25 MG PO TB24: 25.0000 mg | ORAL_TABLET | Freq: Every day | ORAL | 0 refills | Status: DC

## 2022-08-01 MED ORDER — HYDROCHLOROTHIAZIDE 25 MG PO TABS: 25.0000 mg | ORAL_TABLET | Freq: Every day | ORAL | 0 refills | Status: DC

## 2022-08-01 NOTE — Telephone Encounter (Signed)
Patient wants to know if her visit on 10/15 can be a virtual visit.

## 2022-08-01 NOTE — Telephone Encounter (Signed)
Called pt to let her know that Dr. Servando Salina wants an in person appointment since it has been over a year.  Pt has an appointment scheduled.

## 2022-08-01 NOTE — Telephone Encounter (Signed)
*  STAT* If patient is at the pharmacy, call can be transferred to refill team.   1. Which medications need to be refilled? (please list name of each medication and dose if known)   metoprolol succinate (TOPROL XL) 25 MG 24 hr tablet  (completely out) hydrochlorothiazide (HYDRODIURIL) 25 MG tablet (has a little left)  2. Would you like to learn more about the convenience, safety, & potential cost savings by using the [facility]?    3. Are you open to using the [facility] (Type [facility]. ).   4. Which pharmacy/location (including street and city if local pharmacy) is medication to be sent to?  [facility] - [place], [place] - [address]   5. Do they need a 30 day or 90 day supply?   90 day  Patient stated she is running out of these medications.  Patient has appointment scheduled on 10/15.

## 2022-08-07 DIAGNOSIS — I10 Essential (primary) hypertension: Secondary | ICD-10-CM | POA: Diagnosis not present

## 2022-08-07 DIAGNOSIS — E785 Hyperlipidemia, unspecified: Secondary | ICD-10-CM | POA: Diagnosis not present

## 2022-08-07 DIAGNOSIS — R7303 Prediabetes: Secondary | ICD-10-CM | POA: Diagnosis not present

## 2022-08-10 DIAGNOSIS — E785 Hyperlipidemia, unspecified: Secondary | ICD-10-CM | POA: Diagnosis not present

## 2022-08-10 DIAGNOSIS — I10 Essential (primary) hypertension: Secondary | ICD-10-CM | POA: Diagnosis not present

## 2022-08-10 DIAGNOSIS — R7303 Prediabetes: Secondary | ICD-10-CM | POA: Diagnosis not present

## 2022-08-10 DIAGNOSIS — I472 Ventricular tachycardia, unspecified: Secondary | ICD-10-CM | POA: Diagnosis not present

## 2022-08-24 DIAGNOSIS — Z713 Dietary counseling and surveillance: Secondary | ICD-10-CM | POA: Diagnosis not present

## 2022-08-24 DIAGNOSIS — Z6841 Body Mass Index (BMI) 40.0 and over, adult: Secondary | ICD-10-CM | POA: Diagnosis not present

## 2022-08-24 DIAGNOSIS — R7303 Prediabetes: Secondary | ICD-10-CM | POA: Diagnosis not present

## 2022-08-31 DIAGNOSIS — Z713 Dietary counseling and surveillance: Secondary | ICD-10-CM | POA: Diagnosis not present

## 2022-08-31 DIAGNOSIS — Z6841 Body Mass Index (BMI) 40.0 and over, adult: Secondary | ICD-10-CM | POA: Diagnosis not present

## 2022-09-07 DIAGNOSIS — Z6841 Body Mass Index (BMI) 40.0 and over, adult: Secondary | ICD-10-CM | POA: Diagnosis not present

## 2022-09-07 DIAGNOSIS — Z713 Dietary counseling and surveillance: Secondary | ICD-10-CM | POA: Diagnosis not present

## 2022-09-14 DIAGNOSIS — Z6841 Body Mass Index (BMI) 40.0 and over, adult: Secondary | ICD-10-CM | POA: Diagnosis not present

## 2022-09-14 DIAGNOSIS — Z713 Dietary counseling and surveillance: Secondary | ICD-10-CM | POA: Diagnosis not present

## 2022-09-20 DIAGNOSIS — Z713 Dietary counseling and surveillance: Secondary | ICD-10-CM | POA: Diagnosis not present

## 2022-09-20 DIAGNOSIS — Z6841 Body Mass Index (BMI) 40.0 and over, adult: Secondary | ICD-10-CM | POA: Diagnosis not present

## 2022-09-27 DIAGNOSIS — Z6841 Body Mass Index (BMI) 40.0 and over, adult: Secondary | ICD-10-CM | POA: Diagnosis not present

## 2022-09-27 DIAGNOSIS — Z713 Dietary counseling and surveillance: Secondary | ICD-10-CM | POA: Diagnosis not present

## 2022-09-27 DIAGNOSIS — K219 Gastro-esophageal reflux disease without esophagitis: Secondary | ICD-10-CM | POA: Diagnosis not present

## 2022-10-17 ENCOUNTER — Ambulatory Visit: Payer: Self-pay | Attending: Cardiology | Admitting: Cardiology

## 2022-11-14 DIAGNOSIS — Z6841 Body Mass Index (BMI) 40.0 and over, adult: Secondary | ICD-10-CM | POA: Diagnosis not present

## 2022-11-14 DIAGNOSIS — Z713 Dietary counseling and surveillance: Secondary | ICD-10-CM | POA: Diagnosis not present

## 2022-12-05 DIAGNOSIS — I10 Essential (primary) hypertension: Secondary | ICD-10-CM | POA: Diagnosis not present

## 2022-12-05 DIAGNOSIS — R7303 Prediabetes: Secondary | ICD-10-CM | POA: Diagnosis not present

## 2022-12-05 DIAGNOSIS — E785 Hyperlipidemia, unspecified: Secondary | ICD-10-CM | POA: Diagnosis not present

## 2022-12-11 ENCOUNTER — Other Ambulatory Visit: Payer: Self-pay | Admitting: Family Medicine

## 2022-12-11 DIAGNOSIS — Z1231 Encounter for screening mammogram for malignant neoplasm of breast: Secondary | ICD-10-CM

## 2022-12-12 DIAGNOSIS — I10 Essential (primary) hypertension: Secondary | ICD-10-CM | POA: Diagnosis not present

## 2022-12-12 DIAGNOSIS — E785 Hyperlipidemia, unspecified: Secondary | ICD-10-CM | POA: Diagnosis not present

## 2022-12-12 DIAGNOSIS — R7303 Prediabetes: Secondary | ICD-10-CM | POA: Diagnosis not present

## 2022-12-22 ENCOUNTER — Other Ambulatory Visit: Payer: Self-pay | Admitting: Cardiology

## 2022-12-22 MED ORDER — METOPROLOL SUCCINATE ER 25 MG PO TB24: 25.0000 mg | ORAL_TABLET | Freq: Every day | ORAL | 1 refills | Status: DC

## 2022-12-22 MED ORDER — HYDROCHLOROTHIAZIDE 25 MG PO TABS: 25.0000 mg | ORAL_TABLET | Freq: Every day | ORAL | 1 refills | Status: DC

## 2023-01-01 DIAGNOSIS — U071 COVID-19: Secondary | ICD-10-CM | POA: Diagnosis not present

## 2023-01-01 DIAGNOSIS — J029 Acute pharyngitis, unspecified: Secondary | ICD-10-CM | POA: Diagnosis not present

## 2023-01-01 DIAGNOSIS — Z6841 Body Mass Index (BMI) 40.0 and over, adult: Secondary | ICD-10-CM | POA: Diagnosis not present

## 2023-01-01 DIAGNOSIS — Z20822 Contact with and (suspected) exposure to covid-19: Secondary | ICD-10-CM | POA: Diagnosis not present

## 2023-01-24 ENCOUNTER — Ambulatory Visit: Payer: BC Managed Care – PPO | Admitting: Cardiology

## 2023-01-25 DIAGNOSIS — Z6841 Body Mass Index (BMI) 40.0 and over, adult: Secondary | ICD-10-CM | POA: Diagnosis not present

## 2023-01-25 DIAGNOSIS — R053 Chronic cough: Secondary | ICD-10-CM | POA: Diagnosis not present

## 2023-02-08 DIAGNOSIS — Z6841 Body Mass Index (BMI) 40.0 and over, adult: Secondary | ICD-10-CM | POA: Diagnosis not present

## 2023-02-08 DIAGNOSIS — Z23 Encounter for immunization: Secondary | ICD-10-CM | POA: Diagnosis not present

## 2023-02-28 ENCOUNTER — Ambulatory Visit: Payer: BC Managed Care – PPO | Attending: Cardiology | Admitting: Cardiology

## 2023-03-13 DIAGNOSIS — Z6841 Body Mass Index (BMI) 40.0 and over, adult: Secondary | ICD-10-CM | POA: Diagnosis not present

## 2023-04-10 DIAGNOSIS — I472 Ventricular tachycardia, unspecified: Secondary | ICD-10-CM | POA: Diagnosis not present

## 2023-04-10 DIAGNOSIS — E785 Hyperlipidemia, unspecified: Secondary | ICD-10-CM | POA: Diagnosis not present

## 2023-04-10 DIAGNOSIS — Z6841 Body Mass Index (BMI) 40.0 and over, adult: Secondary | ICD-10-CM | POA: Diagnosis not present

## 2023-04-10 DIAGNOSIS — I10 Essential (primary) hypertension: Secondary | ICD-10-CM | POA: Diagnosis not present

## 2023-04-10 DIAGNOSIS — K5900 Constipation, unspecified: Secondary | ICD-10-CM | POA: Diagnosis not present

## 2023-04-10 DIAGNOSIS — R7303 Prediabetes: Secondary | ICD-10-CM | POA: Diagnosis not present

## 2023-04-17 DIAGNOSIS — I10 Essential (primary) hypertension: Secondary | ICD-10-CM | POA: Diagnosis not present

## 2023-04-17 DIAGNOSIS — I472 Ventricular tachycardia, unspecified: Secondary | ICD-10-CM | POA: Diagnosis not present

## 2023-04-17 DIAGNOSIS — E785 Hyperlipidemia, unspecified: Secondary | ICD-10-CM | POA: Diagnosis not present

## 2023-04-17 DIAGNOSIS — R7303 Prediabetes: Secondary | ICD-10-CM | POA: Diagnosis not present

## 2023-05-08 DIAGNOSIS — Z6841 Body Mass Index (BMI) 40.0 and over, adult: Secondary | ICD-10-CM | POA: Diagnosis not present

## 2023-05-08 DIAGNOSIS — R0681 Apnea, not elsewhere classified: Secondary | ICD-10-CM | POA: Diagnosis not present

## 2023-07-03 ENCOUNTER — Other Ambulatory Visit: Payer: Self-pay

## 2023-07-03 MED ORDER — METOPROLOL SUCCINATE ER 25 MG PO TB24: 25.0000 mg | ORAL_TABLET | Freq: Every day | ORAL | 0 refills | Status: DC

## 2023-07-03 MED ORDER — HYDROCHLOROTHIAZIDE 25 MG PO TABS: 25.0000 mg | ORAL_TABLET | Freq: Every day | ORAL | 0 refills | Status: DC

## 2023-07-10 DIAGNOSIS — G4730 Sleep apnea, unspecified: Secondary | ICD-10-CM | POA: Diagnosis not present

## 2023-09-11 DIAGNOSIS — I10 Essential (primary) hypertension: Secondary | ICD-10-CM | POA: Diagnosis not present

## 2023-09-11 DIAGNOSIS — R7303 Prediabetes: Secondary | ICD-10-CM | POA: Diagnosis not present

## 2023-09-11 DIAGNOSIS — E785 Hyperlipidemia, unspecified: Secondary | ICD-10-CM | POA: Diagnosis not present

## 2023-09-24 DIAGNOSIS — R5383 Other fatigue: Secondary | ICD-10-CM | POA: Diagnosis not present

## 2023-09-24 DIAGNOSIS — G4733 Obstructive sleep apnea (adult) (pediatric): Secondary | ICD-10-CM | POA: Diagnosis not present

## 2023-09-24 DIAGNOSIS — R7303 Prediabetes: Secondary | ICD-10-CM | POA: Diagnosis not present

## 2023-09-24 DIAGNOSIS — E785 Hyperlipidemia, unspecified: Secondary | ICD-10-CM | POA: Diagnosis not present

## 2023-09-27 ENCOUNTER — Telehealth: Payer: Self-pay | Admitting: Cardiology

## 2023-09-27 MED ORDER — HYDROCHLOROTHIAZIDE 25 MG PO TABS: 25.0000 mg | ORAL_TABLET | Freq: Every day | ORAL | 0 refills | Status: DC

## 2023-09-27 NOTE — Telephone Encounter (Signed)
 Pt's medication was sent to pt's pharmacy as requested. Confirmation received.

## 2023-09-27 NOTE — Telephone Encounter (Signed)
*  STAT* If patient is at the pharmacy, call can be transferred to refill team.   1. Which medications need to be refilled? (please list name of each medication and dose if known)   hydrochlorothiazide  (HYDRODIURIL ) 25 MG tablet    2. Which pharmacy/location (including street and city if local pharmacy) is medication to be sent to?  [facility] - [place], [place] - [address]      3. Do they need a 30 day or 90 day supply? 90 day    Pt is out of medication

## 2023-09-28 DIAGNOSIS — M9901 Segmental and somatic dysfunction of cervical region: Secondary | ICD-10-CM | POA: Diagnosis not present

## 2023-09-28 DIAGNOSIS — M546 Pain in thoracic spine: Secondary | ICD-10-CM | POA: Diagnosis not present

## 2023-09-28 DIAGNOSIS — M9902 Segmental and somatic dysfunction of thoracic region: Secondary | ICD-10-CM | POA: Diagnosis not present

## 2023-09-28 DIAGNOSIS — R5383 Other fatigue: Secondary | ICD-10-CM | POA: Diagnosis not present

## 2023-09-28 DIAGNOSIS — M5413 Radiculopathy, cervicothoracic region: Secondary | ICD-10-CM | POA: Diagnosis not present

## 2023-10-01 ENCOUNTER — Other Ambulatory Visit: Payer: Self-pay

## 2023-10-01 DIAGNOSIS — M9902 Segmental and somatic dysfunction of thoracic region: Secondary | ICD-10-CM | POA: Diagnosis not present

## 2023-10-01 DIAGNOSIS — M5413 Radiculopathy, cervicothoracic region: Secondary | ICD-10-CM | POA: Diagnosis not present

## 2023-10-01 DIAGNOSIS — M9901 Segmental and somatic dysfunction of cervical region: Secondary | ICD-10-CM | POA: Diagnosis not present

## 2023-10-01 DIAGNOSIS — M546 Pain in thoracic spine: Secondary | ICD-10-CM | POA: Diagnosis not present

## 2023-10-02 ENCOUNTER — Telehealth: Payer: Self-pay | Admitting: Cardiology

## 2023-10-02 MED ORDER — METOPROLOL SUCCINATE ER 25 MG PO TB24: 25.0000 mg | ORAL_TABLET | Freq: Every day | ORAL | 0 refills | Status: DC

## 2023-10-02 NOTE — Telephone Encounter (Signed)
*  STAT* If patient is at the pharmacy, call can be transferred to refill team.   1. Which medications need to be refilled? (please list name of each medication and dose if known) metoprolol  succinate (TOPROL  XL) 25 MG 24 hr tablet    2. Would you like to learn more about the convenience, safety, & potential cost savings by using the [facility]?      3. Are you open to using the [facility] (Type [facility]. ).   4. Which pharmacy/location (including street and city if local pharmacy) is medication to be sent to? [facility] - [place], [place] - [address]    5. Do they need a 30 day or 90 day supply? 30 day

## 2023-10-03 ENCOUNTER — Other Ambulatory Visit: Payer: Self-pay | Admitting: Cardiology

## 2023-10-03 DIAGNOSIS — M5413 Radiculopathy, cervicothoracic region: Secondary | ICD-10-CM | POA: Diagnosis not present

## 2023-10-03 DIAGNOSIS — M546 Pain in thoracic spine: Secondary | ICD-10-CM | POA: Diagnosis not present

## 2023-10-03 NOTE — Telephone Encounter (Signed)
 RX sent in

## 2023-10-05 DIAGNOSIS — M9902 Segmental and somatic dysfunction of thoracic region: Secondary | ICD-10-CM | POA: Diagnosis not present

## 2023-10-05 DIAGNOSIS — M546 Pain in thoracic spine: Secondary | ICD-10-CM | POA: Diagnosis not present

## 2023-10-05 DIAGNOSIS — M5413 Radiculopathy, cervicothoracic region: Secondary | ICD-10-CM | POA: Diagnosis not present

## 2023-10-05 DIAGNOSIS — M9901 Segmental and somatic dysfunction of cervical region: Secondary | ICD-10-CM | POA: Diagnosis not present

## 2023-10-08 DIAGNOSIS — M5413 Radiculopathy, cervicothoracic region: Secondary | ICD-10-CM | POA: Diagnosis not present

## 2023-10-08 DIAGNOSIS — M546 Pain in thoracic spine: Secondary | ICD-10-CM | POA: Diagnosis not present

## 2023-10-30 ENCOUNTER — Encounter: Payer: Self-pay | Admitting: *Deleted

## 2023-10-31 ENCOUNTER — Ambulatory Visit: Admitting: Cardiology

## 2023-11-08 ENCOUNTER — Other Ambulatory Visit: Payer: Self-pay

## 2023-11-09 MED ORDER — HYDROCHLOROTHIAZIDE 25 MG PO TABS: 25.0000 mg | ORAL_TABLET | Freq: Every day | ORAL | 0 refills | Status: DC

## 2023-11-09 MED ORDER — METOPROLOL SUCCINATE ER 25 MG PO TB24: 25.0000 mg | ORAL_TABLET | Freq: Every day | ORAL | 0 refills | Status: DC

## 2023-11-09 NOTE — Telephone Encounter (Signed)
 Pt has an appt on  12/06/2023 Pt has not been seen since 2023. Pt has no showed twice and cancelled once. Would Dr. Sheena like to refill these medication until appt time again? Please address

## 2023-11-09 NOTE — Telephone Encounter (Signed)
*  STAT* If patient is at the pharmacy, call can be transferred to refill team.   1. Which medications need to be refilled? (please list name of each medication and dose if known) metoprolol  succinate (TOPROL  XL) 25 MG 24 hr tablet   2. Which pharmacy/location (including street and city if local pharmacy) is medication to be sent to? [facility] - [place], [place] - [address] Phone: [phone]  Fax: [phone]      3. Do they need a 30 day or 90 day supply? Pt has an appt on  12/06/2023 Pt has not been seen since 2023. Pt has no showed twice and cancelled once.

## 2023-11-09 NOTE — Telephone Encounter (Signed)
 She needs an appointment - We have not seen her since 2023

## 2023-11-14 ENCOUNTER — Encounter

## 2023-12-04 ENCOUNTER — Encounter: Payer: Self-pay | Admitting: *Deleted

## 2023-12-06 ENCOUNTER — Encounter: Payer: Self-pay | Admitting: Cardiology

## 2023-12-06 ENCOUNTER — Ambulatory Visit: Attending: Cardiology | Admitting: Cardiology

## 2023-12-06 VITALS — BP 122/86 | HR 99 | Ht 65.0 in | Wt 275.0 lb

## 2023-12-06 DIAGNOSIS — I4729 Other ventricular tachycardia: Secondary | ICD-10-CM | POA: Diagnosis not present

## 2023-12-06 DIAGNOSIS — I10 Essential (primary) hypertension: Secondary | ICD-10-CM | POA: Diagnosis not present

## 2023-12-06 NOTE — Patient Instructions (Signed)

## 2023-12-06 NOTE — Progress Notes (Unsigned)
 Cardiology Office Note:    Date:  12/06/2023   ID:  Tammy Gamble, Tammy Gamble 08-15-1975, MRN [id number]  PCP:  Trinidad Hun, MD  Cardiologist:  Dub Huntsman, DO  Electrophysiologist:  None   Referring MD: Trinidad Hun, MD    I am doing well  History of Present Illness:    Tammy Gamble is a 48 y.o. female with a hx of hypertension, prediabetes, hyperlipidemia, NSVT seen on the monitor, chronic kidney disease here today for follow-up visit.   Her last visit was in 2023. She has been doing well. And offers no complaints at this time. She also needs medication refills.     Past Medical History:  Diagnosis Date   Anemia    Iron deficiency   Hypertension    Obesity     Past Surgical History:  Procedure Laterality Date   CESAREAN SECTION     1998 & 2011    Current Medications: Current Meds  Medication Sig   hydrochlorothiazide  (HYDRODIURIL ) 25 MG tablet Take 1 tablet (25 mg total) by mouth daily.   losartan  (COZAAR ) 25 MG tablet Take 1 tablet (25 mg total) by mouth daily.   metoprolol  succinate (TOPROL  XL) 25 MG 24 hr tablet Take 1 tablet (25 mg total) by mouth daily.   rosuvastatin (CRESTOR) 5 MG tablet Take 5 mg by mouth 2 (two) times a week.     Allergies:   Sulfa antibiotics   Social History   Socioeconomic History   Marital status: Married    Spouse name: Not on file   Number of children: Not on file   Years of education: Not on file   Highest education level: Not on file  Occupational History   Not on file  Tobacco Use   Smoking status: Never   Smokeless tobacco: Not on file  Substance and Sexual Activity   Alcohol use: No    Alcohol/week: 0.0 standard drinks of alcohol   Drug use: No   Sexual activity: Not on file  Other Topics Concern   Not on file  Social History Narrative   Married   2 children   Social Drivers of Corporate Investment Banker Strain: Not on file  Food Insecurity: Not on file  Transportation Needs: Not on file  Physical  Activity: Not on file  Stress: Not on file  Social Connections: Not on file     Family History: The patient's family history includes Breast cancer in her paternal aunt, paternal grandmother, and paternal great-grandmother; Cancer in her paternal grandfather; Diabetes in her maternal grandmother; Hyperlipidemia in her maternal grandmother; Hypertension in her maternal grandmother.  ROS:   Review of Systems  Constitution: Negative for decreased appetite, fever and weight gain.  HENT: Negative for congestion, ear discharge, hoarse voice and sore throat.   Eyes: Negative for discharge, redness, vision loss in right eye and visual halos.  Cardiovascular: Negative for chest pain, dyspnea on exertion, leg swelling, orthopnea and palpitations.  Respiratory: Negative for cough, hemoptysis, shortness of breath and snoring.   Endocrine: Negative for heat intolerance and polyphagia.  Hematologic/Lymphatic: Negative for bleeding problem. Does not bruise/bleed easily.  Skin: Negative for flushing, nail changes, rash and suspicious lesions.  Musculoskeletal: Negative for arthritis, joint pain, muscle cramps, myalgias, neck pain and stiffness.  Gastrointestinal: Negative for abdominal pain, bowel incontinence, diarrhea and excessive appetite.  Genitourinary: Negative for decreased libido, genital sores and incomplete emptying.  Neurological: Negative for brief paralysis, focal weakness, headaches and loss of balance.  Psychiatric/Behavioral:  Negative for altered mental status, depression and suicidal ideas.  Allergic/Immunologic: Negative for HIV exposure and persistent infections.    EKGs/Labs/Other Studies Reviewed:    The following studies were reviewed today:   EKG:  The ekg ordered today demonstrates sinus rhythm, HR 9 bpm with   Recent Labs: No results found for requested labs within last 365 days.  Recent Lipid Panel No results found for: CHOL, TRIG, HDL, CHOLHDL, VLDL,  LDLCALC, LDLDIRECT  Physical Exam:    VS:  BP 122/86 (BP Location: Right Arm, Patient Position: Sitting, Cuff Size: Large)   Pulse 99   Ht 5' 5 (1.651 m)   Wt 275 lb (124.7 kg)   SpO2 99%   BMI 45.76 kg/m     Wt Readings from Last 3 Encounters:  12/06/23 275 lb (124.7 kg)  04/18/21 268 lb (121.6 kg)  12/15/20 265 lb (120.2 kg)     GEN: Well nourished, well developed in no acute distress HEENT: Normal NECK: No JVD; No carotid bruits LYMPHATICS: No lymphadenopathy CARDIAC: S1S2 noted,RRR, no murmurs, rubs, gallops RESPIRATORY:  Clear to auscultation without rales, wheezing or rhonchi  ABDOMEN: Soft, non-tender, non-distended, +bowel sounds, no guarding. EXTREMITIES: No edema, No cyanosis, no clubbing MUSCULOSKELETAL:  No deformity  SKIN: Warm and dry NEUROLOGIC:  Alert and oriented x 3, non-focal PSYCHIATRIC:  Normal affect, good insight  ASSESSMENT:    1. NSVT (nonsustained ventricular tachycardia) (HCC)   2. Hypertension, unspecified type    PLAN:    Hypertension - blood pressure is at goal.  NSVT - no symptoms. Continue current medications  Morbid obesity - lifestyle modification advise  The patient is in agreement with the above plan. The patient left the office in stable condition.  The patient will follow up in   Medication Adjustments/Labs and Tests Ordered: Current medicines are reviewed at length with the patient today.  Concerns regarding medicines are outlined above.  Orders Placed This Encounter  Procedures   EKG 12-Lead   No orders of the defined types were placed in this encounter.   Patient Instructions  Medication Instructions:  Your physician recommends that you continue on your current medications as directed. Please refer to the Current Medication list given to you today.  *If you need a refill on your cardiac medications before your next appointment, please call your pharmacy*   Follow-Up: At [facility], you and your  health needs are our priority.  As part of our continuing mission to provide you with exceptional heart care, our providers are all part of one team.  This team includes your primary Cardiologist (physician) and Advanced Practice Providers or APPs (Physician Assistants and Nurse Practitioners) who all work together to provide you with the care you need, when you need it.  Your next appointment:   1 year(s)  Provider:   Tyla Burgner, DO      Adopting a Healthy Lifestyle.  Know what a healthy weight is for you (roughly BMI <25) and aim to maintain this   Aim for 7+ servings of fruits and vegetables daily   65-80+ fluid ounces of water or unsweet tea for healthy kidneys   Limit to max 1 drink of alcohol per day; avoid smoking/tobacco   Limit animal fats in diet for cholesterol and heart health - choose grass fed whenever available   Avoid highly processed foods, and foods high in saturated/trans fats   Aim for low stress - take time to unwind and care for your mental health  Aim for 150 min of moderate intensity exercise weekly for heart health, and weights twice weekly for bone health   Aim for 7-9 hours of sleep daily   When it comes to diets, agreement about the perfect plan isnt easy to find, even among the experts. Experts at the [facility] [employer] developed an idea known as the Healthy Eating Plate. Just imagine a plate divided into logical, healthy portions.   The emphasis is on diet quality:   Load up on vegetables and fruits - one-half of your plate: Aim for color and variety, and remember that potatoes dont count.   Go for whole grains - one-quarter of your plate: Whole wheat, barley, wheat berries, quinoa, oats, brown rice, and foods made with them. If you want pasta, go with whole wheat pasta.   Protein power - one-quarter of your plate: Fish, chicken, beans, and nuts are all healthy, versatile protein sources. Limit red meat.   The diet, however, does  go beyond the plate, offering a few other suggestions.   Use healthy plant oils, such as olive, canola, soy, corn, sunflower and peanut. Check the labels, and avoid partially hydrogenated oil, which have unhealthy trans fats.   If youre thirsty, drink water. Coffee and tea are good in moderation, but skip sugary drinks and limit milk and dairy products to one or two daily servings.   The type of carbohydrate in the diet is more important than the amount. Some sources of carbohydrates, such as vegetables, fruits, whole grains, and beans-are healthier than others.   Finally, stay active  Signed, Dub Huntsman, DO  12/06/2023 4:27 PM    [facility]

## 2023-12-11 ENCOUNTER — Other Ambulatory Visit: Payer: Self-pay

## 2023-12-13 MED ORDER — LOSARTAN POTASSIUM 25 MG PO TABS: 25.0000 mg | ORAL_TABLET | Freq: Every day | ORAL | 3 refills | Status: AC

## 2023-12-13 MED ORDER — ROSUVASTATIN CALCIUM 5 MG PO TABS: 5.0000 mg | ORAL_TABLET | ORAL | 3 refills | Status: AC

## 2023-12-13 MED ORDER — HYDROCHLOROTHIAZIDE 25 MG PO TABS: 25.0000 mg | ORAL_TABLET | Freq: Every day | ORAL | 3 refills | Status: AC

## 2023-12-13 MED ORDER — METOPROLOL SUCCINATE ER 25 MG PO TB24: 25.0000 mg | ORAL_TABLET | Freq: Every day | ORAL | 3 refills | Status: AC
